# Patient Record
Sex: Female | Born: 1985
Health system: Southern US, Community
[De-identification: ages and names within clinical notes are randomized; demographics above are authoritative.]

## PROBLEM LIST (undated history)

## (undated) DIAGNOSIS — E669 Obesity, unspecified: Secondary | ICD-10-CM

## (undated) DIAGNOSIS — R079 Chest pain, unspecified: Secondary | ICD-10-CM

## (undated) DIAGNOSIS — E0500 Thyrotoxicosis with diffuse goiter without thyrotoxic crisis or storm: Secondary | ICD-10-CM

## (undated) DIAGNOSIS — I493 Ventricular premature depolarization: Secondary | ICD-10-CM

## (undated) DIAGNOSIS — E0590 Thyrotoxicosis, unspecified without thyrotoxic crisis or storm: Secondary | ICD-10-CM

## (undated) DIAGNOSIS — E063 Autoimmune thyroiditis: Secondary | ICD-10-CM

## (undated) DIAGNOSIS — F329 Major depressive disorder, single episode, unspecified: Secondary | ICD-10-CM

## (undated) DIAGNOSIS — Z9889 Other specified postprocedural states: Secondary | ICD-10-CM

## (undated) DIAGNOSIS — E559 Vitamin D deficiency, unspecified: Secondary | ICD-10-CM

## (undated) DIAGNOSIS — N719 Inflammatory disease of uterus, unspecified: Secondary | ICD-10-CM

## (undated) DIAGNOSIS — F419 Anxiety disorder, unspecified: Secondary | ICD-10-CM

## (undated) DIAGNOSIS — Z8639 Personal history of other endocrine, nutritional and metabolic disease: Secondary | ICD-10-CM

## (undated) DIAGNOSIS — O8612 Endometritis following delivery: Secondary | ICD-10-CM

## (undated) DIAGNOSIS — Z98891 History of uterine scar from previous surgery: Secondary | ICD-10-CM

## (undated) HISTORY — DX: Vitamin D deficiency, unspecified: E55.9

## (undated) HISTORY — DX: Autoimmune thyroiditis: E06.3

## (undated) HISTORY — DX: Thyrotoxicosis with diffuse goiter without thyrotoxic crisis or storm: E05.00

## (undated) HISTORY — DX: Anxiety disorder, unspecified: F41.9

## (undated) HISTORY — DX: Chest pain, unspecified: R07.9

## (undated) HISTORY — DX: Ventricular premature depolarization: I49.3

## (undated) HISTORY — DX: Endometritis following delivery: O86.12

## (undated) HISTORY — DX: Obesity, unspecified: E66.9

## (undated) HISTORY — DX: Inflammatory disease of uterus, unspecified: N71.9

## (undated) HISTORY — DX: History of uterine scar from previous surgery: Z98.891

## (undated) HISTORY — PX: CHOLECYSTECTOMY: SHX55

## (undated) HISTORY — DX: Personal history of other endocrine, nutritional and metabolic disease: Z86.39

## (undated) HISTORY — DX: Major depressive disorder, single episode, unspecified: F32.9

## (undated) HISTORY — PX: DILATION AND CURETTAGE OF UTERUS: SHX78

## (undated) HISTORY — DX: Other specified postprocedural states: Z98.890

---
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
## 2004-03-20 ENCOUNTER — Other Ambulatory Visit: Admission: RE | Admit: 2004-03-20 | Discharge: 2004-03-20 | Payer: Self-pay | Admitting: Obstetrics and Gynecology

## 2005-04-08 ENCOUNTER — Other Ambulatory Visit: Admission: RE | Admit: 2005-04-08 | Discharge: 2005-04-08 | Payer: Self-pay | Admitting: Obstetrics and Gynecology

## 2006-08-02 ENCOUNTER — Inpatient Hospital Stay (HOSPITAL_COMMUNITY): Admission: AD | Admit: 2006-08-02 | Discharge: 2006-08-02 | Payer: Self-pay | Admitting: Obstetrics and Gynecology

## 2006-08-10 ENCOUNTER — Inpatient Hospital Stay (HOSPITAL_COMMUNITY): Admission: RE | Admit: 2006-08-10 | Discharge: 2006-08-10 | Payer: Self-pay | Admitting: Obstetrics and Gynecology

## 2006-08-21 ENCOUNTER — Inpatient Hospital Stay (HOSPITAL_COMMUNITY): Admission: AD | Admit: 2006-08-21 | Discharge: 2006-08-24 | Payer: Self-pay | Admitting: Obstetrics and Gynecology

## 2006-08-21 ENCOUNTER — Inpatient Hospital Stay (HOSPITAL_COMMUNITY): Admission: AD | Admit: 2006-08-21 | Discharge: 2006-08-24 | Payer: Self-pay | Admitting: Obstetrics & Gynecology

## 2009-06-29 ENCOUNTER — Ambulatory Visit (HOSPITAL_COMMUNITY): Admission: RE | Admit: 2009-06-29 | Discharge: 2009-06-29 | Payer: Self-pay | Admitting: Obstetrics and Gynecology

## 2010-06-12 ENCOUNTER — Ambulatory Visit (HOSPITAL_COMMUNITY)
Admission: RE | Admit: 2010-06-12 | Discharge: 2010-06-12 | Payer: Self-pay | Source: Home / Self Care | Attending: Obstetrics and Gynecology | Admitting: Obstetrics and Gynecology

## 2010-06-26 ENCOUNTER — Inpatient Hospital Stay (HOSPITAL_COMMUNITY)
Admission: RE | Admit: 2010-06-26 | Discharge: 2010-06-28 | Payer: Self-pay | Source: Home / Self Care | Attending: Obstetrics and Gynecology | Admitting: Obstetrics and Gynecology

## 2010-07-02 LAB — CBC
HCT: 28.9 % — ABNORMAL LOW (ref 36.0–46.0)
Hemoglobin: 9.7 g/dL — ABNORMAL LOW (ref 12.0–15.0)
MCH: 31.9 pg (ref 26.0–34.0)
MCHC: 33.6 g/dL (ref 30.0–36.0)
MCV: 95.1 fL (ref 78.0–100.0)
Platelets: 130 10*3/uL — ABNORMAL LOW (ref 150–400)
RBC: 3.04 MIL/uL — ABNORMAL LOW (ref 3.87–5.11)
RDW: 13.5 % (ref 11.5–15.5)
WBC: 10.6 10*3/uL — ABNORMAL HIGH (ref 4.0–10.5)

## 2010-07-09 NOTE — Discharge Summary (Signed)
  Krista Long, Krista Long              ACCOUNT NO.:  [id number]  MEDICAL RECORD NO.:  [id number]          PATIENT TYPE:  INP  LOCATION:  9113                          FACILITY:  [facility]  PHYSICIAN:  Kendra H. Tenny Craw, MD     DATE OF BIRTH:  [birth year]  DATE OF ADMISSION:  06/26/2010 DATE OF DISCHARGE:  06/28/2010                              DISCHARGE SUMMARY   DISCHARGING PHYSICIAN:  Enrique Sack H. Tenny Craw, MD  HOSPITAL PROCEDURES: 1. Primary low transverse cesarean section. 2. Spinal anesthesia. 3. Circumcision of female infant.  HOSPITAL COURSE:  [name] is a 25 year old now G3, P2-0-1-1, who presented at 27 weeks and 0 days' estimated gestational age for scheduled primary low transverse cesarean section for persistent breech presentation.  Pregnancy had been uncomplicated except for persistent frank breech presentation.  On June 26, 2010, the patient underwent a primary low transverse cesarean section by Dr. Malva Limes for delivery of a vigorous female infant weighing 7 pounds 13 ounces with Apgar scores of 9 and 9.  Postoperatively, the patient did well.  The female infant had a circumcision performed by Dr. Conley Simmonds on June 27, 2010.  By June 28, 2010, the patient desired early discharge home.  She was instructed on signs or symptoms to be aware of for need to return to the hospital.  [name] will remain in place and she will follow up either Friday, June 29, 2010; or Monday, July 02, 2010, in the office for staple removal.  She will be given a prescription for Percocet 5 mg 1-2 p.o. q.4-6 hours as needed for pain.  DISCHARGE LABORATORY DATA:  White blood cell count 10.6, hemoglobin 9.7, hematocrit 28.9, platelets 130.     Freddrick March. Tenny Craw, MD     K[facility]/MEDQ  D:  06/28/2010  T:  06/28/2010  Job:  [id number]  Electronically Signed by Waynard Reeds MD on 07/09/2010 08:49:16 PM

## 2010-07-23 ENCOUNTER — Other Ambulatory Visit: Payer: Self-pay | Admitting: Obstetrics and Gynecology

## 2010-08-27 LAB — CBC
HCT: 36.7 % (ref 36.0–46.0)
Hemoglobin: 12.3 g/dL (ref 12.0–15.0)
MCH: 31.2 pg (ref 26.0–34.0)
MCHC: 33.5 g/dL (ref 30.0–36.0)
MCV: 93.1 fL (ref 78.0–100.0)
Platelets: 150 10*3/uL (ref 150–400)
RBC: 3.94 MIL/uL (ref 3.87–5.11)
RDW: 13.3 % (ref 11.5–15.5)
WBC: 8.3 10*3/uL (ref 4.0–10.5)

## 2010-08-27 LAB — SURGICAL PCR SCREEN
MRSA, PCR: NEGATIVE
Staphylococcus aureus: NEGATIVE

## 2010-08-27 LAB — RPR: RPR Ser Ql: NONREACTIVE

## 2010-09-02 LAB — CBC
HCT: 40.3 % (ref 36.0–46.0)
Hemoglobin: 13.7 g/dL (ref 12.0–15.0)
MCHC: 34.1 g/dL (ref 30.0–36.0)
MCV: 88.8 fL (ref 78.0–100.0)
Platelets: 203 10*3/uL (ref 150–400)
RBC: 4.53 MIL/uL (ref 3.87–5.11)
RDW: 12.8 % (ref 11.5–15.5)
WBC: 6.6 10*3/uL (ref 4.0–10.5)

## 2010-09-02 LAB — ABO/RH: ABO/RH(D): A POS

## 2013-05-25 ENCOUNTER — Other Ambulatory Visit: Payer: Self-pay | Admitting: Obstetrics and Gynecology

## 2013-05-25 LAB — OB RESULTS CONSOLE ANTIBODY SCREEN: ANTIBODY SCREEN: NEGATIVE

## 2013-05-25 LAB — OB RESULTS CONSOLE GC/CHLAMYDIA
CHLAMYDIA, DNA PROBE: NEGATIVE
GC PROBE AMP, GENITAL: NEGATIVE

## 2013-05-25 LAB — OB RESULTS CONSOLE RUBELLA ANTIBODY, IGM: RUBELLA: IMMUNE

## 2013-05-25 LAB — OB RESULTS CONSOLE HEPATITIS B SURFACE ANTIGEN: Hepatitis B Surface Ag: NEGATIVE

## 2013-05-25 LAB — OB RESULTS CONSOLE ABO/RH: RH Type: POSITIVE

## 2013-05-25 LAB — OB RESULTS CONSOLE HIV ANTIBODY (ROUTINE TESTING): HIV: NONREACTIVE

## 2013-05-25 LAB — OB RESULTS CONSOLE RPR: RPR: NONREACTIVE

## 2013-12-22 ENCOUNTER — Encounter (HOSPITAL_COMMUNITY): Payer: Self-pay

## 2013-12-22 ENCOUNTER — Encounter (HOSPITAL_COMMUNITY): Payer: BC Managed Care – PPO | Admitting: Anesthesiology

## 2013-12-22 ENCOUNTER — Inpatient Hospital Stay (HOSPITAL_COMMUNITY): Payer: BC Managed Care – PPO | Admitting: Anesthesiology

## 2013-12-22 ENCOUNTER — Inpatient Hospital Stay (HOSPITAL_COMMUNITY): Payer: BC Managed Care – PPO

## 2013-12-22 ENCOUNTER — Inpatient Hospital Stay (HOSPITAL_COMMUNITY)
Admission: AD | Admit: 2013-12-22 | Discharge: 2013-12-22 | Disposition: A | Discharge status: Home / Self Care | Payer: BC Managed Care – PPO | Source: Ambulatory Visit | Attending: Obstetrics and Gynecology | Admitting: Obstetrics and Gynecology

## 2013-12-22 ENCOUNTER — Encounter (HOSPITAL_COMMUNITY): Payer: Self-pay | Admitting: *Deleted

## 2013-12-22 ENCOUNTER — Inpatient Hospital Stay (HOSPITAL_COMMUNITY)
Admission: AD | Admit: 2013-12-22 | Discharge: 2013-12-25 | DRG: 765 | Disposition: A | Discharge status: Home / Self Care | Payer: BC Managed Care – PPO | Source: Ambulatory Visit | Attending: Obstetrics and Gynecology | Admitting: Obstetrics and Gynecology

## 2013-12-22 DIAGNOSIS — O479 False labor, unspecified: Secondary | ICD-10-CM | POA: Insufficient documentation

## 2013-12-22 DIAGNOSIS — O34219 Maternal care for unspecified type scar from previous cesarean delivery: Principal | ICD-10-CM | POA: Diagnosis present

## 2013-12-22 DIAGNOSIS — Z9889 Other specified postprocedural states: Secondary | ICD-10-CM

## 2013-12-22 DIAGNOSIS — O99284 Endocrine, nutritional and metabolic diseases complicating childbirth: Secondary | ICD-10-CM | POA: Diagnosis present

## 2013-12-22 DIAGNOSIS — E0590 Thyrotoxicosis, unspecified without thyrotoxic crisis or storm: Secondary | ICD-10-CM | POA: Diagnosis present

## 2013-12-22 DIAGNOSIS — E079 Disorder of thyroid, unspecified: Secondary | ICD-10-CM | POA: Diagnosis present

## 2013-12-22 DIAGNOSIS — D649 Anemia, unspecified: Secondary | ICD-10-CM | POA: Diagnosis present

## 2013-12-22 DIAGNOSIS — O9903 Anemia complicating the puerperium: Secondary | ICD-10-CM | POA: Diagnosis present

## 2013-12-22 HISTORY — DX: Thyrotoxicosis, unspecified without thyrotoxic crisis or storm: E05.90

## 2013-12-22 LAB — CBC
HCT: 37.5 % (ref 36.0–46.0)
HEMOGLOBIN: 13.4 g/dL (ref 12.0–15.0)
MCH: 32.1 pg (ref 26.0–34.0)
MCHC: 35.7 g/dL (ref 30.0–36.0)
MCV: 89.7 fL (ref 78.0–100.0)
Platelets: 169 10*3/uL (ref 150–400)
RBC: 4.18 MIL/uL (ref 3.87–5.11)
RDW: 12.9 % (ref 11.5–15.5)
WBC: 12.2 10*3/uL — AB (ref 4.0–10.5)

## 2013-12-22 LAB — OB RESULTS CONSOLE GBS: GBS: NEGATIVE

## 2013-12-22 MED ORDER — LACTATED RINGERS IV SOLN
INTRAVENOUS | Status: DC
  Administered 2013-12-22 – 2013-12-23 (×2): via INTRAVENOUS

## 2013-12-22 MED ORDER — LACTATED RINGERS IV SOLN
500.0000 mL | Freq: Once | INTRAVENOUS | Status: AC
  Administered 2013-12-22: 500 mL via INTRAVENOUS

## 2013-12-22 MED ORDER — EPHEDRINE 5 MG/ML INJ: 10.0000 mg | INTRAVENOUS | Status: DC | PRN

## 2013-12-22 MED ORDER — LIDOCAINE HCL (PF) 1 % IJ SOLN: 30.0000 mL | INTRAMUSCULAR | Status: DC | PRN

## 2013-12-22 MED ORDER — OXYTOCIN BOLUS FROM INFUSION: 500.0000 mL | INTRAVENOUS | Status: DC

## 2013-12-22 MED ORDER — FLEET ENEMA 7-19 GM/118ML RE ENEM
1.0000 | ENEMA | RECTAL | Status: DC | PRN
Start: 2013-12-22 — End: 2013-12-23

## 2013-12-22 MED ORDER — IBUPROFEN 600 MG PO TABS: 600.0000 mg | ORAL_TABLET | Freq: Four times a day (QID) | ORAL | Status: DC | PRN

## 2013-12-22 MED ORDER — OXYCODONE-ACETAMINOPHEN 5-325 MG PO TABS: 1.0000 | ORAL_TABLET | ORAL | Status: DC | PRN

## 2013-12-22 MED ORDER — DIPHENHYDRAMINE HCL 50 MG/ML IJ SOLN: 12.5000 mg | INTRAMUSCULAR | Status: DC | PRN

## 2013-12-22 MED ORDER — LACTATED RINGERS IV SOLN: 500.0000 mL | INTRAVENOUS | Status: DC | PRN

## 2013-12-22 MED ORDER — CITRIC ACID-SODIUM CITRATE 334-500 MG/5ML PO SOLN
30.0000 mL | ORAL | Status: DC | PRN
  Administered 2013-12-23: 30 mL via ORAL
  Filled 2013-12-22 (×2): qty 15

## 2013-12-22 MED ORDER — ACETAMINOPHEN 325 MG PO TABS: 650.0000 mg | ORAL_TABLET | ORAL | Status: DC | PRN

## 2013-12-22 MED ORDER — FENTANYL 2.5 MCG/ML BUPIVACAINE 1/10 % EPIDURAL INFUSION (WH - ANES)
14.0000 mL/h | INTRAMUSCULAR | Status: DC | PRN
  Administered 2013-12-22: 14 mL/h via EPIDURAL
  Filled 2013-12-22: qty 125

## 2013-12-22 MED ORDER — ONDANSETRON HCL 4 MG/2ML IJ SOLN: 4.0000 mg | Freq: Four times a day (QID) | INTRAMUSCULAR | Status: DC | PRN

## 2013-12-22 MED ORDER — OXYTOCIN 40 UNITS IN LACTATED RINGERS INFUSION - SIMPLE MED: 62.5000 mL/h | INTRAVENOUS | Status: DC

## 2013-12-22 MED ORDER — PHENYLEPHRINE 40 MCG/ML (10ML) SYRINGE FOR IV PUSH (FOR BLOOD PRESSURE SUPPORT): 80.0000 ug | PREFILLED_SYRINGE | INTRAVENOUS | Status: DC | PRN

## 2013-12-22 MED ORDER — PHENYLEPHRINE 40 MCG/ML (10ML) SYRINGE FOR IV PUSH (FOR BLOOD PRESSURE SUPPORT)
80.0000 ug | PREFILLED_SYRINGE | INTRAVENOUS | Status: DC | PRN
  Filled 2013-12-22: qty 10

## 2013-12-22 NOTE — Anesthesia Procedure Notes (Signed)
Epidural Patient location during procedure: OB Start time: 12/22/2013 11:48 PM  Staffing Performed by: anesthesiologist   Preanesthetic Checklist Completed: patient identified, site marked, surgical consent, pre-op evaluation, timeout performed, IV checked, risks and benefits discussed and monitors and equipment checked  Epidural Patient position: sitting Prep: site prepped and draped and DuraPrep Patient monitoring: continuous pulse ox and blood pressure Approach: midline Injection technique: LOR air  Needle:  Needle type: Tuohy  Needle gauge: 17 G Needle length: 9 cm and 9 Needle insertion depth: 5 cm cm Catheter type: closed end flexible Catheter size: 19 Gauge Catheter at skin depth: 10 cm Test dose: negative  Assessment Events: blood not aspirated, injection not painful, no injection resistance, negative IV test and no paresthesia  Additional Notes Discussed risk of headache, infection, bleeding, nerve injury and failed or incomplete block.  Patient voices understanding and wishes to proceed.  Epidural placed easily on first attempt.  No paresthesia.  Patient tolerated procedure well with no apparent complications.  Jasmine DecemberA. Krithik Mapel, MDReason for block:procedure for pain

## 2013-12-22 NOTE — MAU Note (Signed)
Pt. States contractions are stronger and closer together than when she was here earlier.Baby has been moving well. EFM placed.

## 2013-12-22 NOTE — MAU Note (Signed)
Patient states she is having contractions every 5-8 minutes. Denies bleeding or leaking today,. Reports less than usual fetal movement. Plans a TOLAC.

## 2013-12-22 NOTE — H&P (Addendum)
28 y.o. 7840w0d  [id number] comes in c/o labor- second visit tonight.  Hx of C/S for breech- desires TOLAC.  Otherwise has good fetal movement and no bleeding.  Pt's strip initially at second visit was not reactive and BPP was ordered.  NST became reactive just before being sent to [facility] and has been since without decels.  However, BPP was only 4/10 (no movement).  Cardiac activity appeared normal and pt has sensation now of movement.  Admitted for labor and monitoring of baby.  Past Medical History  Diagnosis Date  . Hyperthyroidism     Past Surgical History  Procedure Laterality Date  . Cesarean section    . Dilation and curettage of uterus    . Cholecystectomy      OB History  Gravida Para Term Preterm AB SAB TAB Ectopic Multiple Living  4 2 2  1 1    2     # Outcome Date GA Lbr Len/2nd Weight Sex Delivery Anes PTL Lv  4 CUR           3 SAB           2 TRM           1 TRM               History   Social History  . Marital Status: Married    Spouse Name: N/A    Number of Children: N/A  . Years of Education: N/A   Occupational History  . Not on file.   Social History Main Topics  . Smoking status: Never Smoker   . Smokeless tobacco: Not on file  . Alcohol Use: No  . Drug Use: No  . Sexual Activity: Yes    Birth Control/ Protection: None   Other Topics Concern  . Not on file   Social History Narrative  . No narrative on file   Review of patient's allergies indicates no known allergies.    Prenatal Transfer Tool  Maternal Diabetes: No Genetic Screening: Declined Maternal Ultrasounds/Referrals: Normal Fetal Ultrasounds or other Referrals:  None Maternal Substance Abuse:  No Significant Maternal Medications:  None Significant Maternal Lab Results: None  Other PNC: uncomplicated.    Filed Vitals:   12/22/13 1952  BP: 117/73  Pulse: 99  Temp: 98 F (36.7 C)  Resp: 16     Lungs/Cor:  NAD Abdomen:  soft, gravid Ex:  no cords, erythema SVE:  3/80/-2 per MAU  nurse; now 4/80/-2, AROM clear FHTs:  120, good STV, NST R, NO decels- cat 1 Toco:  q5-10   A/P   Early active labor which may have affected BPP- admit with continuous monitoring; hold IV pain meds.  GBS neg.   Desires TOLAC.  IUPC and FSE placed for close monitoring.   Dain Laseter A

## 2013-12-22 NOTE — Discharge Instructions (Signed)
Third Trimester of Pregnancy °The third trimester is from week 29 through week 42, months 7 through 9. The third trimester is a time when the fetus is growing rapidly. At the end of the ninth month, the fetus is about 20 inches in length and weighs 6-10 pounds.  °BODY CHANGES °Your body goes through many changes during pregnancy. The changes vary from woman to woman.  °· Your weight will continue to increase. You can expect to gain 25-35 pounds (11-16 kg) by the end of the pregnancy. °· You may begin to get stretch marks on your hips, abdomen, and breasts. °· You may urinate more often because the fetus is moving lower into your pelvis and pressing on your bladder. °· You may develop or continue to have heartburn as a result of your pregnancy. °· You may develop constipation because certain hormones are causing the muscles that push waste through your intestines to slow down. °· You may develop hemorrhoids or swollen, bulging veins (varicose veins). °· You may have pelvic pain because of the weight gain and pregnancy hormones relaxing your joints between the bones in your pelvis. Backaches may result from overexertion of the muscles supporting your posture. °· You may have changes in your hair. These can include thickening of your hair, rapid growth, and changes in texture. Some women also have hair loss during or after pregnancy, or hair that feels dry or thin. Your hair will most likely return to normal after your baby is born. °· Your breasts will continue to grow and be tender. A yellow discharge may leak from your breasts called colostrum. °· Your belly button may stick out. °· You may feel short of breath because of your expanding uterus. °· You may notice the fetus "dropping," or moving lower in your abdomen. °· You may have a bloody mucus discharge. This usually occurs a few days to a week before labor begins. °· Your cervix becomes thin and soft (effaced) near your due date. °WHAT TO EXPECT AT YOUR PRENATAL  EXAMS  °You will have prenatal exams every 2 weeks until week 36. Then, you will have weekly prenatal exams. During a routine prenatal visit: °· You will be weighed to make sure you and the fetus are growing normally. °· Your blood pressure is taken. °· Your abdomen will be measured to track your baby's growth. °· The fetal heartbeat will be listened to. °· Any test results from the previous visit will be discussed. °· You may have a cervical check near your due date to see if you have effaced. °At around 36 weeks, your caregiver will check your cervix. At the same time, your caregiver will also perform a test on the secretions of the vaginal tissue. This test is to determine if a type of bacteria, Group B streptococcus, is present. Your caregiver will explain this further. °Your caregiver may ask you: °· What your birth plan is. °· How you are feeling. °· If you are feeling the baby move. °· If you have had any abnormal symptoms, such as leaking fluid, bleeding, severe headaches, or abdominal cramping. °· If you have any questions. °Other tests or screenings that may be performed during your third trimester include: °· Blood tests that check for low iron levels (anemia). °· Fetal testing to check the health, activity level, and growth of the fetus. Testing is done if you have certain medical conditions or if there are problems during the pregnancy. °FALSE LABOR °You may feel small, irregular contractions that   eventually go away. These are called Braxton Hicks contractions, or false labor. Contractions may last for hours, days, or even weeks before true labor sets in. If contractions come at regular intervals, intensify, or become painful, it is best to be seen by your caregiver.  °SIGNS OF LABOR  °· Menstrual-like cramps. °· Contractions that are 5 minutes apart or less. °· Contractions that start on the top of the uterus and spread down to the lower abdomen and back. °· A sense of increased pelvic pressure or back  pain. °· A watery or bloody mucus discharge that comes from the vagina. °If you have any of these signs before the 37th week of pregnancy, call your caregiver right away. You need to go to the hospital to get checked immediately. °HOME CARE INSTRUCTIONS  °· Avoid all smoking, herbs, alcohol, and unprescribed drugs. These chemicals affect the formation and growth of the baby. °· Follow your caregiver's instructions regarding medicine use. There are medicines that are either safe or unsafe to take during pregnancy. °· Exercise only as directed by your caregiver. Experiencing uterine cramps is a good sign to stop exercising. °· Continue to eat regular, healthy meals. °· Wear a good support bra for breast tenderness. °· Do not use hot tubs, steam rooms, or saunas. °· Wear your seat belt at all times when driving. °· Avoid raw meat, uncooked cheese, cat litter boxes, and soil used by cats. These carry germs that can cause birth defects in the baby. °· Take your prenatal vitamins. °· Try taking a stool softener (if your caregiver approves) if you develop constipation. Eat more high-fiber foods, such as fresh vegetables or fruit and whole grains. Drink plenty of fluids to keep your urine clear or pale yellow. °· Take warm sitz baths to soothe any pain or discomfort caused by hemorrhoids. Use hemorrhoid cream if your caregiver approves. °· If you develop varicose veins, wear support hose. Elevate your feet for 15 minutes, 3-4 times a day. Limit salt in your diet. °· Avoid heavy lifting, wear low heal shoes, and practice good posture. °· Rest a lot with your legs elevated if you have leg cramps or low back pain. °· Visit your dentist if you have not gone during your pregnancy. Use a soft toothbrush to brush your teeth and be gentle when you floss. °· A sexual relationship may be continued unless your caregiver directs you otherwise. °· Do not travel far distances unless it is absolutely necessary and only with the approval  of your caregiver. °· Take prenatal classes to understand, practice, and ask questions about the labor and delivery. °· Make a trial run to the hospital. °· Pack your hospital bag. °· Prepare the baby's nursery. °· Continue to go to all your prenatal visits as directed by your caregiver. °SEEK MEDICAL CARE IF: °· You are unsure if you are in labor or if your water has broken. °· You have dizziness. °· You have mild pelvic cramps, pelvic pressure, or nagging pain in your abdominal area. °· You have persistent nausea, vomiting, or diarrhea. °· You have a bad smelling vaginal discharge. °· You have pain with urination. °SEEK IMMEDIATE MEDICAL CARE IF:  °· You have a fever. °· You are leaking fluid from your vagina. °· You have spotting or bleeding from your vagina. °· You have severe abdominal cramping or pain. °· You have rapid weight loss or gain. °· You have shortness of breath with chest pain. °· You notice sudden or extreme swelling   of your face, hands, ankles, feet, or legs. °· You have not felt your baby move in over an hour. °· You have severe headaches that do not go away with medicine. °· You have vision changes. °Document Released: 05/28/2001 Document Revised: 06/08/2013 Document Reviewed: 08/04/2012 °[employer]® Patient Information ©2015 [employer]. This information is not intended to replace advice given to you by your health care provider. Make sure you discuss any questions you have with your health care provider. °Fetal Movement Counts °Patient Name: __________________________________________________ Patient Due Date: ____________________ °Performing a fetal movement count is highly recommended in high-risk pregnancies, but it is good for every pregnant woman to do. Your caregiver may ask you to start counting fetal movements at 28 weeks of the pregnancy. Fetal movements often increase: °· After eating a full meal. °· After physical activity. °· After eating or drinking something sweet or cold. °· At  rest. °Pay attention to when you feel the baby is most active. This will help you notice a pattern of your baby's sleep and wake cycles and what factors contribute to an increase in fetal movement. It is important to perform a fetal movement count at the same time each day when your baby is normally most active.  °HOW TO COUNT FETAL MOVEMENTS °1. Find a quiet and comfortable area to sit or lie down on your left side. Lying on your left side provides the best blood and oxygen circulation to your baby. °2. Write down the day and time on a sheet of paper or in a journal. °3. Start counting kicks, flutters, swishes, rolls, or jabs in a 2 hour period. You should feel at least 10 movements within 2 hours. °4. If you do not feel 10 movements in 2 hours, wait 2-3 hours and count again. Look for a change in the pattern or not enough counts in 2 hours. °SEEK MEDICAL CARE IF: °· You feel less than 10 counts in 2 hours, tried twice. °· There is no movement in over an hour. °· The pattern is changing or taking longer each day to reach 10 counts in 2 hours. °· You feel the baby is not moving as he or she usually does. °Date: ____________ Movements: ____________ Start time: ____________ Finish time: ____________  °Date: ____________ Movements: ____________ Start time: ____________ Finish time: ____________ °Date: ____________ Movements: ____________ Start time: ____________ Finish time: ____________ °Date: ____________ Movements: ____________ Start time: ____________ Finish time: ____________ °Date: ____________ Movements: ____________ Start time: ____________ Finish time: ____________ °Date: ____________ Movements: ____________ Start time: ____________ Finish time: ____________ °Date: ____________ Movements: ____________ Start time: ____________ Finish time: ____________ °Date: ____________ Movements: ____________ Start time: ____________ Finish time: ____________  °Date: ____________ Movements: ____________ Start time:  ____________ Finish time: ____________ °Date: ____________ Movements: ____________ Start time: ____________ Finish time: ____________ °Date: ____________ Movements: ____________ Start time: ____________ Finish time: ____________ °Date: ____________ Movements: ____________ Start time: ____________ Finish time: ____________ °Date: ____________ Movements: ____________ Start time: ____________ Finish time: ____________ °Date: ____________ Movements: ____________ Start time: ____________ Finish time: ____________ °Date: ____________ Movements: ____________ Start time: ____________ Finish time: ____________  °Date: ____________ Movements: ____________ Start time: ____________ Finish time: ____________ °Date: ____________ Movements: ____________ Start time: ____________ Finish time: ____________ °Date: ____________ Movements: ____________ Start time: ____________ Finish time: ____________ °Date: ____________ Movements: ____________ Start time: ____________ Finish time: ____________ °Date: ____________ Movements: ____________ Start time: ____________ Finish time: ____________ °Date: ____________ Movements: ____________ Start time: ____________ Finish time: ____________ °Date: ____________ Movements: ____________ Start time: ____________ Finish time: ____________  °  Date: ____________ Movements: ____________ Start time: ____________ Finish time: ____________ °Date: ____________ Movements: ____________ Start time: ____________ Finish time: ____________ °Date: ____________ Movements: ____________ Start time: ____________ Finish time: ____________ °Date: ____________ Movements: ____________ Start time: ____________ Finish time: ____________ °Date: ____________ Movements: ____________ Start time: ____________ Finish time: ____________ °Date: ____________ Movements: ____________ Start time: ____________ Finish time: ____________ °Date: ____________ Movements: ____________ Start time: ____________ Finish time: ____________  °Date:  ____________ Movements: ____________ Start time: ____________ Finish time: ____________ °Date: ____________ Movements: ____________ Start time: ____________ Finish time: ____________ °Date: ____________ Movements: ____________ Start time: ____________ Finish time: ____________ °Date: ____________ Movements: ____________ Start time: ____________ Finish time: ____________ °Date: ____________ Movements: ____________ Start time: ____________ Finish time: ____________ °Date: ____________ Movements: ____________ Start time: ____________ Finish time: ____________ °Date: ____________ Movements: ____________ Start time: ____________ Finish time: ____________  °Date: ____________ Movements: ____________ Start time: ____________ Finish time: ____________ °Date: ____________ Movements: ____________ Start time: ____________ Finish time: ____________ °Date: ____________ Movements: ____________ Start time: ____________ Finish time: ____________ °Date: ____________ Movements: ____________ Start time: ____________ Finish time: ____________ °Date: ____________ Movements: ____________ Start time: ____________ Finish time: ____________ °Date: ____________ Movements: ____________ Start time: ____________ Finish time: ____________ °Date: ____________ Movements: ____________ Start time: ____________ Finish time: ____________  °Date: ____________ Movements: ____________ Start time: ____________ Finish time: ____________ °Date: ____________ Movements: ____________ Start time: ____________ Finish time: ____________ °Date: ____________ Movements: ____________ Start time: ____________ Finish time: ____________ °Date: ____________ Movements: ____________ Start time: ____________ Finish time: ____________ °Date: ____________ Movements: ____________ Start time: ____________ Finish time: ____________ °Date: ____________ Movements: ____________ Start time: ____________ Finish time: ____________ °Date: ____________ Movements: ____________ Start  time: ____________ Finish time: ____________  °Date: ____________ Movements: ____________ Start time: ____________ Finish time: ____________ °Date: ____________ Movements: ____________ Start time: ____________ Finish time: ____________ °Date: ____________ Movements: ____________ Start time: ____________ Finish time: ____________ °Date: ____________ Movements: ____________ Start time: ____________ Finish time: ____________ °Date: ____________ Movements: ____________ Start time: ____________ Finish time: ____________ °Date: ____________ Movements: ____________ Start time: ____________ Finish time: ____________ °Document Released: 07/03/2006 Document Revised: 05/20/2012 Document Reviewed: 03/30/2012 °[employer]® Patient Information ©2015 [employer]. This information is not intended to replace advice given to you by your health care provider. Make sure you discuss any questions you have with your health care provider. ° °

## 2013-12-22 NOTE — Anesthesia Preprocedure Evaluation (Signed)
Anesthesia Evaluation  Patient identified by MRN, date of birth, ID band Patient awake    Reviewed: Allergy & Precautions, H&P , NPO status , Patient's Chart, lab work & pertinent test results, reviewed documented beta blocker date and time   History of Anesthesia Complications Negative for: history of anesthetic complications  Airway Mallampati: II TM Distance: >3 FB Neck ROM: full    Dental  (+) Teeth Intact   Pulmonary neg pulmonary ROS,  breath sounds clear to auscultation        Cardiovascular negative cardio ROS  Rhythm:regular Rate:Normal     Neuro/Psych negative neurological ROS  negative psych ROS   GI/Hepatic Neg liver ROS, GERD-  Medicated,  Endo/Other  Hyperthyroidism   Renal/GU negative Renal ROS     Musculoskeletal   Abdominal   Peds  Hematology negative hematology ROS (+)   Anesthesia Other Findings   Reproductive/Obstetrics (+) Pregnancy (h/o c/s x1 for breech, attempting VBAC)                           Anesthesia Physical Anesthesia Plan  ASA: II  Anesthesia Plan: Epidural   Post-op Pain Management:    Induction:   Airway Management Planned:   Additional Equipment:   Intra-op Plan:   Post-operative Plan:   Informed Consent: I have reviewed the patients History and Physical, chart, labs and discussed the procedure including the risks, benefits and alternatives for the proposed anesthesia with the patient or authorized representative who has indicated his/her understanding and acceptance.     Plan Discussed with:   Anesthesia Plan Comments:         Anesthesia Quick Evaluation

## 2013-12-23 ENCOUNTER — Inpatient Hospital Stay (HOSPITAL_COMMUNITY): Payer: BC Managed Care – PPO

## 2013-12-23 ENCOUNTER — Encounter (HOSPITAL_COMMUNITY): Payer: Self-pay

## 2013-12-23 ENCOUNTER — Encounter (HOSPITAL_COMMUNITY)
Admission: AD | Disposition: A | Discharge status: Home / Self Care | Payer: Self-pay | Source: Ambulatory Visit | Attending: Obstetrics and Gynecology

## 2013-12-23 DIAGNOSIS — Z9889 Other specified postprocedural states: Secondary | ICD-10-CM

## 2013-12-23 HISTORY — DX: Other specified postprocedural states: Z98.890

## 2013-12-23 LAB — CBC
HCT: 23.3 % — ABNORMAL LOW (ref 36.0–46.0)
HCT: 21 % — ABNORMAL LOW (ref 36.0–46.0)
HEMATOCRIT: 25.4 % — AB (ref 36.0–46.0)
Hemoglobin: 8.8 g/dL — ABNORMAL LOW (ref 12.0–15.0)
Hemoglobin: 8.1 g/dL — ABNORMAL LOW (ref 12.0–15.0)
Hemoglobin: 7.1 g/dL — ABNORMAL LOW (ref 12.0–15.0)
MCH: 31.3 pg (ref 26.0–34.0)
MCH: 31.3 pg (ref 26.0–34.0)
MCH: 30.5 pg (ref 26.0–34.0)
MCHC: 34.6 g/dL (ref 30.0–36.0)
MCHC: 34.8 g/dL (ref 30.0–36.0)
MCHC: 33.8 g/dL (ref 30.0–36.0)
MCV: 90.4 fL (ref 78.0–100.0)
MCV: 90 fL (ref 78.0–100.0)
MCV: 90.1 fL (ref 78.0–100.0)
PLATELETS: 135 10*3/uL — AB (ref 150–400)
Platelets: 161 10*3/uL (ref 150–400)
Platelets: 158 10*3/uL (ref 150–400)
RBC: 2.81 MIL/uL — AB (ref 3.87–5.11)
RBC: 2.59 MIL/uL — ABNORMAL LOW (ref 3.87–5.11)
RBC: 2.33 MIL/uL — AB (ref 3.87–5.11)
RDW: 12.7 % (ref 11.5–15.5)
RDW: 12.8 % (ref 11.5–15.5)
RDW: 12.9 % (ref 11.5–15.5)
WBC: 15.7 10*3/uL — ABNORMAL HIGH (ref 4.0–10.5)
WBC: 13.6 10*3/uL — ABNORMAL HIGH (ref 4.0–10.5)
WBC: 10.9 10*3/uL — ABNORMAL HIGH (ref 4.0–10.5)

## 2013-12-23 LAB — PREPARE RBC (CROSSMATCH)

## 2013-12-23 LAB — RPR

## 2013-12-23 LAB — BIRTH TISSUE RECOVERY COLLECTION (PLACENTA DONATION)

## 2013-12-23 SURGERY — Surgical Case
Anesthesia: Epidural | Site: Abdomen

## 2013-12-23 MED ORDER — LANOLIN HYDROUS EX OINT: 1.0000 "application " | TOPICAL_OINTMENT | CUTANEOUS | Status: DC | PRN

## 2013-12-23 MED ORDER — PRENATAL MULTIVITAMIN CH
1.0000 | ORAL_TABLET | Freq: Every day | ORAL | Status: DC
  Administered 2013-12-24 – 2013-12-25 (×2): 1 via ORAL
  Filled 2013-12-23 (×2): qty 1

## 2013-12-23 MED ORDER — PHENYLEPHRINE HCL 10 MG/ML IJ SOLN
INTRAMUSCULAR | Status: DC | PRN
  Administered 2013-12-23 (×2): 80 ug via INTRAVENOUS

## 2013-12-23 MED ORDER — DIPHENHYDRAMINE HCL 25 MG PO CAPS: 25.0000 mg | ORAL_CAPSULE | ORAL | Status: DC | PRN

## 2013-12-23 MED ORDER — PHENYLEPHRINE 40 MCG/ML (10ML) SYRINGE FOR IV PUSH (FOR BLOOD PRESSURE SUPPORT)
PREFILLED_SYRINGE | INTRAVENOUS | Status: AC
  Filled 2013-12-23: qty 5

## 2013-12-23 MED ORDER — ONDANSETRON HCL 4 MG PO TABS: 4.0000 mg | ORAL_TABLET | ORAL | Status: DC | PRN

## 2013-12-23 MED ORDER — ZOLPIDEM TARTRATE 5 MG PO TABS: 5.0000 mg | ORAL_TABLET | Freq: Every evening | ORAL | Status: DC | PRN

## 2013-12-23 MED ORDER — KETOROLAC TROMETHAMINE 30 MG/ML IJ SOLN: 30.0000 mg | Freq: Four times a day (QID) | INTRAMUSCULAR | Status: AC | PRN

## 2013-12-23 MED ORDER — FERROUS SULFATE 325 (65 FE) MG PO TABS
325.0000 mg | ORAL_TABLET | Freq: Two times a day (BID) | ORAL | Status: DC
  Administered 2013-12-24 – 2013-12-25 (×3): 325 mg via ORAL
  Filled 2013-12-23 (×3): qty 1

## 2013-12-23 MED ORDER — FAMOTIDINE 20 MG PO TABS
10.0000 mg | ORAL_TABLET | Freq: Two times a day (BID) | ORAL | Status: DC
  Administered 2013-12-23 – 2013-12-24 (×2): 10 mg via ORAL
  Administered 2013-12-25: 20 mg via ORAL
  Filled 2013-12-23: qty 0.5
  Filled 2013-12-23 (×4): qty 1

## 2013-12-23 MED ORDER — OXYTOCIN 10 UNIT/ML IJ SOLN
INTRAMUSCULAR | Status: AC
  Filled 2013-12-23: qty 4

## 2013-12-23 MED ORDER — NALBUPHINE HCL 10 MG/ML IJ SOLN: 5.0000 mg | INTRAMUSCULAR | Status: DC | PRN

## 2013-12-23 MED ORDER — MENTHOL 3 MG MT LOZG: 1.0000 | LOZENGE | OROMUCOSAL | Status: DC | PRN

## 2013-12-23 MED ORDER — LACTATED RINGERS IV SOLN
INTRAVENOUS | Status: DC | PRN
  Administered 2013-12-23: 04:00:00 via INTRAVENOUS

## 2013-12-23 MED ORDER — SODIUM BICARBONATE 8.4 % IV SOLN
INTRAVENOUS | Status: DC | PRN
  Administered 2013-12-23: 2 mL via EPIDURAL
  Administered 2013-12-23: 3 mL via EPIDURAL
  Administered 2013-12-23 (×2): 5 mL via EPIDURAL

## 2013-12-23 MED ORDER — TETANUS-DIPHTH-ACELL PERTUSSIS 5-2.5-18.5 LF-MCG/0.5 IM SUSP: 0.5000 mL | Freq: Once | INTRAMUSCULAR | Status: DC

## 2013-12-23 MED ORDER — FENTANYL CITRATE 0.05 MG/ML IJ SOLN
INTRAMUSCULAR | Status: AC
  Filled 2013-12-23: qty 2

## 2013-12-23 MED ORDER — OXYTOCIN 40 UNITS IN LACTATED RINGERS INFUSION - SIMPLE MED: 62.5000 mL/h | INTRAVENOUS | Status: AC

## 2013-12-23 MED ORDER — MEPERIDINE HCL 25 MG/ML IJ SOLN: 6.2500 mg | INTRAMUSCULAR | Status: DC | PRN

## 2013-12-23 MED ORDER — LACTATED RINGERS IV SOLN
INTRAVENOUS | Status: DC
  Administered 2013-12-23: 12:00:00 via INTRAVENOUS

## 2013-12-23 MED ORDER — LACTATED RINGERS IV SOLN
INTRAVENOUS | Status: DC
  Administered 2013-12-23: 13:00:00 via INTRAVENOUS

## 2013-12-23 MED ORDER — KETOROLAC TROMETHAMINE 60 MG/2ML IM SOLN
INTRAMUSCULAR | Status: AC
  Filled 2013-12-23: qty 2

## 2013-12-23 MED ORDER — SODIUM CHLORIDE 0.9 % IJ SOLN
3.0000 mL | INTRAMUSCULAR | Status: DC | PRN
  Administered 2013-12-24: 3 mL via INTRAVENOUS

## 2013-12-23 MED ORDER — CEFAZOLIN SODIUM-DEXTROSE 2-3 GM-% IV SOLR
INTRAVENOUS | Status: DC | PRN
  Administered 2013-12-23: 2 g via INTRAVENOUS

## 2013-12-23 MED ORDER — MORPHINE SULFATE 0.5 MG/ML IJ SOLN
INTRAMUSCULAR | Status: AC
  Filled 2013-12-23: qty 10

## 2013-12-23 MED ORDER — MEASLES, MUMPS & RUBELLA VAC ~~LOC~~ INJ: 0.5000 mL | INJECTION | Freq: Once | SUBCUTANEOUS | Status: DC

## 2013-12-23 MED ORDER — ONDANSETRON HCL 4 MG/2ML IJ SOLN: 4.0000 mg | Freq: Three times a day (TID) | INTRAMUSCULAR | Status: DC | PRN

## 2013-12-23 MED ORDER — SIMETHICONE 80 MG PO CHEW
80.0000 mg | CHEWABLE_TABLET | ORAL | Status: DC
  Administered 2013-12-24 (×2): 80 mg via ORAL
  Filled 2013-12-23 (×2): qty 1

## 2013-12-23 MED ORDER — SCOPOLAMINE 1 MG/3DAYS TD PT72
MEDICATED_PATCH | TRANSDERMAL | Status: AC
  Filled 2013-12-23: qty 1

## 2013-12-23 MED ORDER — ONDANSETRON HCL 4 MG/2ML IJ SOLN
INTRAMUSCULAR | Status: AC
  Filled 2013-12-23: qty 2

## 2013-12-23 MED ORDER — ONDANSETRON HCL 4 MG/2ML IJ SOLN
INTRAMUSCULAR | Status: DC | PRN
  Administered 2013-12-23: 4 mg via INTRAVENOUS

## 2013-12-23 MED ORDER — IOHEXOL 300 MG/ML  SOLN
100.0000 mL | Freq: Once | INTRAMUSCULAR | Status: AC | PRN
  Administered 2013-12-23: 100 mL via INTRAVENOUS

## 2013-12-23 MED ORDER — LIDOCAINE HCL (PF) 1 % IJ SOLN
INTRAMUSCULAR | Status: DC | PRN
  Administered 2013-12-22 (×2): 4 mL
  Administered 2013-12-22: 2 mL
  Administered 2013-12-22: 4 mL

## 2013-12-23 MED ORDER — SCOPOLAMINE 1 MG/3DAYS TD PT72
1.0000 | MEDICATED_PATCH | Freq: Once | TRANSDERMAL | Status: DC
  Administered 2013-12-23: 1.5 mg via TRANSDERMAL

## 2013-12-23 MED ORDER — PHENYLEPHRINE 8 MG IN D5W 100 ML (0.08MG/ML) PREMIX OPTIME
INJECTION | INTRAVENOUS | Status: AC
  Filled 2013-12-23: qty 100

## 2013-12-23 MED ORDER — FENTANYL CITRATE 0.05 MG/ML IJ SOLN
INTRAMUSCULAR | Status: DC | PRN
  Administered 2013-12-23: 100 ug via EPIDURAL

## 2013-12-23 MED ORDER — METHYLERGONOVINE MALEATE 0.2 MG PO TABS: 0.2000 mg | ORAL_TABLET | ORAL | Status: DC | PRN

## 2013-12-23 MED ORDER — FENTANYL CITRATE 0.05 MG/ML IJ SOLN
100.0000 ug | Freq: Once | INTRAMUSCULAR | Status: AC
  Administered 2013-12-23 (×2): 100 ug via EPIDURAL
  Filled 2013-12-23: qty 2

## 2013-12-23 MED ORDER — FENTANYL CITRATE 0.05 MG/ML IJ SOLN: 25.0000 ug | INTRAMUSCULAR | Status: DC | PRN

## 2013-12-23 MED ORDER — BISACODYL 10 MG RE SUPP: 10.0000 mg | Freq: Every day | RECTAL | Status: DC | PRN

## 2013-12-23 MED ORDER — SIMETHICONE 80 MG PO CHEW
80.0000 mg | CHEWABLE_TABLET | Freq: Three times a day (TID) | ORAL | Status: DC
  Administered 2013-12-23 – 2013-12-25 (×5): 80 mg via ORAL
  Filled 2013-12-23 (×6): qty 1

## 2013-12-23 MED ORDER — OXYTOCIN 10 UNIT/ML IJ SOLN
40.0000 [IU] | INTRAVENOUS | Status: DC | PRN
  Administered 2013-12-23: 40 [IU] via INTRAVENOUS

## 2013-12-23 MED ORDER — OXYCODONE-ACETAMINOPHEN 5-325 MG PO TABS
1.0000 | ORAL_TABLET | ORAL | Status: DC | PRN
  Administered 2013-12-24 – 2013-12-25 (×5): 1 via ORAL
  Filled 2013-12-23 (×5): qty 1

## 2013-12-23 MED ORDER — BUPIVACAINE HCL (PF) 0.25 % IJ SOLN
INTRAMUSCULAR | Status: DC | PRN
  Administered 2013-12-23 (×2): 5 mL via EPIDURAL

## 2013-12-23 MED ORDER — PHENYLEPHRINE 8 MG IN D5W 100 ML (0.08MG/ML) PREMIX OPTIME
INJECTION | INTRAVENOUS | Status: DC | PRN
  Administered 2013-12-23: 80 ug/min via INTRAVENOUS

## 2013-12-23 MED ORDER — CEFAZOLIN SODIUM-DEXTROSE 2-3 GM-% IV SOLR: 2.0000 g | Freq: Once | INTRAVENOUS | Status: DC

## 2013-12-23 MED ORDER — FLEET ENEMA 7-19 GM/118ML RE ENEM: 1.0000 | ENEMA | Freq: Every day | RECTAL | Status: DC | PRN

## 2013-12-23 MED ORDER — NALOXONE HCL 1 MG/ML IJ SOLN: 1.0000 ug/kg/h | INTRAVENOUS | Status: DC | PRN

## 2013-12-23 MED ORDER — CEFAZOLIN SODIUM-DEXTROSE 2-3 GM-% IV SOLR
INTRAVENOUS | Status: AC
  Filled 2013-12-23: qty 50

## 2013-12-23 MED ORDER — DIBUCAINE 1 % RE OINT: 1.0000 "application " | TOPICAL_OINTMENT | RECTAL | Status: DC | PRN

## 2013-12-23 MED ORDER — IBUPROFEN 600 MG PO TABS
600.0000 mg | ORAL_TABLET | Freq: Four times a day (QID) | ORAL | Status: DC
  Administered 2013-12-23 – 2013-12-25 (×7): 600 mg via ORAL
  Filled 2013-12-23 (×7): qty 1

## 2013-12-23 MED ORDER — METOCLOPRAMIDE HCL 5 MG/ML IJ SOLN
INTRAMUSCULAR | Status: AC
  Filled 2013-12-23: qty 2

## 2013-12-23 MED ORDER — MORPHINE SULFATE (PF) 0.5 MG/ML IJ SOLN
INTRAMUSCULAR | Status: DC | PRN
  Administered 2013-12-23: 4 mg via EPIDURAL

## 2013-12-23 MED ORDER — LACTATED RINGERS IV SOLN
INTRAVENOUS | Status: DC
  Administered 2013-12-23 (×3): via INTRAVENOUS

## 2013-12-23 MED ORDER — BUTORPHANOL TARTRATE 1 MG/ML IJ SOLN
1.0000 mg | INTRAMUSCULAR | Status: DC | PRN
  Administered 2013-12-23: 1 mg via INTRAVENOUS
  Filled 2013-12-23: qty 1

## 2013-12-23 MED ORDER — WITCH HAZEL-GLYCERIN EX PADS: 1.0000 "application " | MEDICATED_PAD | CUTANEOUS | Status: DC | PRN

## 2013-12-23 MED ORDER — DIPHENHYDRAMINE HCL 25 MG PO CAPS
25.0000 mg | ORAL_CAPSULE | Freq: Four times a day (QID) | ORAL | Status: DC | PRN
Start: 2013-12-23 — End: 2013-12-25

## 2013-12-23 MED ORDER — NALOXONE HCL 0.4 MG/ML IJ SOLN: 0.4000 mg | INTRAMUSCULAR | Status: DC | PRN

## 2013-12-23 MED ORDER — LACTATED RINGERS IV BOLUS (SEPSIS)
500.0000 mL | Freq: Once | INTRAVENOUS | Status: AC
  Administered 2013-12-23: 500 mL via INTRAVENOUS

## 2013-12-23 MED ORDER — METHYLERGONOVINE MALEATE 0.2 MG/ML IJ SOLN: 0.2000 mg | INTRAMUSCULAR | Status: DC | PRN

## 2013-12-23 MED ORDER — SENNOSIDES-DOCUSATE SODIUM 8.6-50 MG PO TABS
2.0000 | ORAL_TABLET | ORAL | Status: DC
  Administered 2013-12-24 (×2): 2 via ORAL
  Filled 2013-12-23 (×2): qty 2

## 2013-12-23 MED ORDER — ONDANSETRON HCL 4 MG/2ML IJ SOLN: 4.0000 mg | INTRAMUSCULAR | Status: DC | PRN

## 2013-12-23 MED ORDER — METOCLOPRAMIDE HCL 5 MG/ML IJ SOLN
10.0000 mg | Freq: Three times a day (TID) | INTRAMUSCULAR | Status: DC | PRN
  Administered 2013-12-23: 10 mg via INTRAVENOUS

## 2013-12-23 MED ORDER — SIMETHICONE 80 MG PO CHEW
80.0000 mg | CHEWABLE_TABLET | ORAL | Status: DC | PRN
  Administered 2013-12-23: 80 mg via ORAL
  Filled 2013-12-23: qty 1

## 2013-12-23 MED ORDER — DIPHENHYDRAMINE HCL 50 MG/ML IJ SOLN: 25.0000 mg | INTRAMUSCULAR | Status: DC | PRN

## 2013-12-23 MED ORDER — KETOROLAC TROMETHAMINE 60 MG/2ML IM SOLN
60.0000 mg | Freq: Once | INTRAMUSCULAR | Status: AC | PRN
  Administered 2013-12-23: 60 mg via INTRAMUSCULAR

## 2013-12-23 MED ORDER — METOCLOPRAMIDE HCL 5 MG/ML IJ SOLN: 10.0000 mg | Freq: Once | INTRAMUSCULAR | Status: DC | PRN

## 2013-12-23 MED ORDER — DIPHENHYDRAMINE HCL 50 MG/ML IJ SOLN: 12.5000 mg | INTRAMUSCULAR | Status: DC | PRN

## 2013-12-23 MED ORDER — MEPERIDINE HCL 25 MG/ML IJ SOLN
INTRAMUSCULAR | Status: DC | PRN
Start: 2013-12-23 — End: 2013-12-23
  Administered 2013-12-23 (×2): 12.5 mg via INTRAVENOUS

## 2013-12-23 SURGICAL SUPPLY — 35 items
APL SKNCLS STERI-STRIP NONHPOA (GAUZE/BANDAGES/DRESSINGS) ×1
BENZOIN TINCTURE PRP APPL 2/3 (GAUZE/BANDAGES/DRESSINGS) ×3 IMPLANT
CLAMP CORD UMBIL (MISCELLANEOUS) IMPLANT
CLOSURE WOUND 1/2 X4 (GAUZE/BANDAGES/DRESSINGS) ×1
CLOTH BEACON ORANGE TIMEOUT ST (SAFETY) ×3 IMPLANT
DRAPE LG THREE QUARTER DISP (DRAPES) IMPLANT
DRSG OPSITE POSTOP 4X10 (GAUZE/BANDAGES/DRESSINGS) ×3 IMPLANT
DURAPREP 26ML APPLICATOR (WOUND CARE) ×3 IMPLANT
ELECT REM PT RETURN 9FT ADLT (ELECTROSURGICAL) ×3
ELECTRODE REM PT RTRN 9FT ADLT (ELECTROSURGICAL) ×1 IMPLANT
EXTRACTOR VACUUM BELL STYLE (SUCTIONS) IMPLANT
GLOVE BIO SURGEON STRL SZ7 (GLOVE) ×3 IMPLANT
GOWN STRL REUS W/TWL LRG LVL3 (GOWN DISPOSABLE) ×6 IMPLANT
KIT ABG SYR 3ML LUER SLIP (SYRINGE) IMPLANT
NDL HYPO 25X5/8 SAFETYGLIDE (NEEDLE) IMPLANT
NEEDLE HYPO 25X5/8 SAFETYGLIDE (NEEDLE) IMPLANT
NS IRRIG 1000ML POUR BTL (IV SOLUTION) ×3 IMPLANT
PACK C SECTION WH (CUSTOM PROCEDURE TRAY) ×3 IMPLANT
PAD OB MATERNITY 4.3X12.25 (PERSONAL CARE ITEMS) ×3 IMPLANT
RETRACTOR WND ALEXIS 25 LRG (MISCELLANEOUS) ×1 IMPLANT
RTRCTR WOUND ALEXIS 25CM LRG (MISCELLANEOUS) ×3
STAPLER VISISTAT 35W (STAPLE) IMPLANT
STRIP CLOSURE SKIN 1/2X4 (GAUZE/BANDAGES/DRESSINGS) ×2 IMPLANT
SUT MNCRL 0 VIOLET CTX 36 (SUTURE) ×2 IMPLANT
SUT MONOCRYL 0 CTX 36 (SUTURE) ×6
SUT PDS AB 0 CTX 60 (SUTURE) IMPLANT
SUT PLAIN 2 0 XLH (SUTURE) ×3 IMPLANT
SUT VIC AB 0 CT1 27 (SUTURE) ×6
SUT VIC AB 0 CT1 27XBRD ANBCTR (SUTURE) ×2 IMPLANT
SUT VIC AB 2-0 CT1 27 (SUTURE) ×3
SUT VIC AB 2-0 CT1 TAPERPNT 27 (SUTURE) ×1 IMPLANT
SUT VIC AB 4-0 KS 27 (SUTURE) ×3 IMPLANT
TOWEL OR 17X24 6PK STRL BLUE (TOWEL DISPOSABLE) ×3 IMPLANT
TRAY FOLEY CATH 14FR (SET/KITS/TRAYS/PACK) ×1 IMPLANT
WATER STERILE IRR 1000ML POUR (IV SOLUTION) ×1 IMPLANT

## 2013-12-23 NOTE — Progress Notes (Signed)
Spouse concerned with changes in cognition. Noted mild pupil dilatation bilaterally, which is responsive to light. Speech clear and concise, but patient seems confused about events of the day. Anesthesia and OB notified.

## 2013-12-23 NOTE — Transfer of Care (Signed)
Immediate Anesthesia Transfer of Care Note  Patient: Krista Long  Procedure(s) Performed: Procedure(s): CESAREAN SECTION (N/A)  Patient Location: PACU  Anesthesia Type:Epidural  Level of Consciousness: awake, alert  and sedated  Airway & Oxygen Therapy: Patient Spontanous Breathing  Post-op Assessment: Report given to PACU RN and Post -op Vital signs reviewed and stable  Post vital signs: Reviewed and stable  Complications: No apparent anesthesia complications

## 2013-12-23 NOTE — Progress Notes (Signed)
Addendum to op note:  At time of closure of uterus, there was bleeding from the L corner of the incision that required 4 additional figure of eight stitches to ensure  Hemostasis.  The uterus had been removed from the peritoneal cavity to ensure that the stitches were anterior and not posterior.  Hemostasis had been achieved and blood loss was calculated but it is possible that more was lost than seen.  Pt had remained stable throughout.   Dr. Dareen PianoAnderson kept me informed of pt's lack of urine- 30 cc for 6 hours.  Pt received 1 liter bolus.  Now pt does have some orthostatics but is assymptomatic at rest, her H/H have been stable at about 8 and now pt has put out 75 cc in last 1:30.    Filed Vitals:   12/23/13 1237 12/23/13 1253 12/23/13 1255 12/23/13 1259  BP: 95/61 95/61 95/64  88/58  Pulse: 75 72 89 126  Temp: 98.9 F (37.2 C)  98 F (36.7 C)   TempSrc: Axillary  Oral   Resp: 18 18 18 18   Height:      Weight:      SpO2: 96% 96% 96%     Lungs CTA Cor slightly tachy, no MRG Abd slightly distended with gas but good bowel sounds and soft, normal tenderness and no rebound or guarding.  Incision is intact without hemotoma and only slight blood on dressing Ex some swelling, SCDs on Pelvic deferred for now.   Results for orders placed during the hospital encounter of 12/22/13 (from the past 48 hour(s))  OB RESULTS CONSOLE GBS     Status: None   Collection Time    12/22/13 12:00 AM      Result Value Ref Range   GBS Negative    TYPE AND SCREEN     Status: None   Collection Time    12/22/13  5:30 AM      Result Value Ref Range   ABO/RH(D) A POS     Antibody Screen NEG     Sample Expiration 12/25/2013    CBC     Status: Abnormal   Collection Time    12/22/13 11:00 PM      Result Value Ref Range   WBC 12.2 (*) 4.0 - 10.5 K/uL   RBC 4.18  3.87 - 5.11 MIL/uL   Hemoglobin 13.4  12.0 - 15.0 g/dL   HCT 40.937.5  81.136.0 - 91.446.0 %   MCV 89.7  78.0 - 100.0 fL   MCH 32.1  26.0 - 34.0 pg   MCHC  35.7  30.0 - 36.0 g/dL   RDW 78.212.9  95.611.5 - 21.315.5 %   Platelets 169  150 - 400 K/uL  RPR     Status: None   Collection Time    12/22/13 11:00 PM      Result Value Ref Range   RPR NON REAC  NON REAC   Comment: Performed at [facility]  CBC     Status: Abnormal   Collection Time    12/23/13  9:12 AM      Result Value Ref Range   WBC 15.7 (*) 4.0 - 10.5 K/uL   RBC 2.81 (*) 3.87 - 5.11 MIL/uL   Hemoglobin 8.8 (*) 12.0 - 15.0 g/dL   Comment: DELTA CHECK NOTED     REPEATED TO VERIFY   HCT 25.4 (*) 36.0 - 46.0 %   MCV 90.4  78.0 - 100.0 fL   MCH 31.3  26.0 -  34.0 pg   MCHC 34.6  30.0 - 36.0 g/dL   RDW 81.1  91.4 - 78.2 %   Platelets 161  150 - 400 K/uL  CBC     Status: Abnormal   Collection Time    12/23/13 12:15 PM      Result Value Ref Range   WBC 13.6 (*) 4.0 - 10.5 K/uL   RBC 2.59 (*) 3.87 - 5.11 MIL/uL   Hemoglobin 8.1 (*) 12.0 - 15.0 g/dL   HCT 95.6 (*) 21.3 - 08.6 %   MCV 90.0  78.0 - 100.0 fL   MCH 31.3  26.0 - 34.0 pg   MCHC 34.8  30.0 - 36.0 g/dL   RDW 57.8  46.9 - 62.9 %   Platelets 158  150 - 400 K/uL  BIRTH TISSUE RECOVERY COLLECTION (PLACENTA DONATION)     Status: None   Collection Time    12/23/13 12:15 PM      Result Value Ref Range   Placenta donation bld collect COLLECTED BY LABORATORY      I agree with Dr. Dareen Piano that a CT would at least rule out expanding hematoma in broad ligament or retroperitoneal.  Pt seems much more stable after bolus- I increased fluids to 200 cc/hour.  Now that the UOP has picked up, if radiology agrees, she should have contrast to look for bleeders and ureters.

## 2013-12-23 NOTE — Progress Notes (Signed)
Admission nutrition screen triggered for weight loss > 10 lbs.Patients chart reviewed and assessed  for nutritional risk. PNR record indicates no weight loss.  Patient is determined to be at low nutrition  risk.   Elisabeth CaraKatherine Emaline Karnes M.Odis LusterEd. R.D. LDN Neonatal Nutrition Support Specialist/RD III Pager [phone]

## 2013-12-23 NOTE — Consult Note (Signed)
Neonatology Note:   Attendance at C-section:    I was asked by Dr. Henderson CloudHorvath to attend this repeat C/S at term after TOLAC. The mother is a G4P2A1 A pos, GBS neg with onset of incision pain with contractions. ROM 6 hours prior to delivery, fluid clear. Infant vigorous with good spontaneous cry and tone. Needed only minimal bulb suctioning. Ap 8/9. Lungs clear to ausc in DR. 1-2/6 systolic murmur heard over LSB, radiating to the lung fields. Pulse oximeter placed, showed normal O2 saturations. Perfusion excellent. Informed parents and supervising nurse of murmur, probably transitional, and will check back in a few minutes. To CN to care of Pediatrician.   Doretha Souhristie C. Zinedine Ellner, MD

## 2013-12-23 NOTE — Progress Notes (Signed)
Patient admitted to floor around 0800. VS 92/62 121 18 97.4 and O2 sats 97%; small amount of drainage marked on honeycomb dressing.  Fundus firm and one above.  Lungs clear.  Small amount of urine in foley bag and amber in color.  Dr. Dareen PianoAnderson in room making rounds.  Informed him of vs, decreased urinary output and drainage on dressing.  Orders received for LR 500 cc bolus and CBC to be drawn. RN will continue to monitor.  Mother Baby RN- Emilio MathLanette Robinn [facility]

## 2013-12-23 NOTE — Progress Notes (Signed)
Pt was noted by husband and nursing staff to have some mental status changes over the last 4-6 hours. She is oriented x 4 but occ makes comments that are unrelated to the situation. When I was in the room the pt stated "that know that the water is broken what are we going to do". She received no narcotics today. She does have a scop patch on. She does have a h.o. Being disoriented with anesthetic meds.  Pt has normal motor and sensory function. Her pupils are dialated but react to light. VSSAF, good urine output. IMP/ New onset of mental status changes. Possibly from Scop patch. No neurologic deficit. Plan/ Will remove patch and reevaluate. If persists will ask for Neuro input.

## 2013-12-23 NOTE — Lactation Note (Signed)
This note was copied from the chart of Krista Donn PieriniDanielle Koller. Lactation Consultation Note     Initial consult with this mom of a term baby, now 5210 hours old. The baby has voided twice, and breast fed well, according to mom, four times . The baby was being held by a family member, asleep at this time. Lynette, RN, informed me that mom has had a difficult day, and may need to go back to surgery. I briefly reviewed lactation and community services with mom, she told me this baby is latching well, and she had no needs or questions, but mom knows to call for questions/concerns.   Patient Name: Krista Long ZOXWR'UToday's Date: 12/23/2013 Reason for consult: Initial assessment   Maternal Data Formula Feeding for Exclusion: No Infant to breast within first hour of birth: No Breastfeeding delayed due to:: Maternal status Has patient been taught Hand Expression?: Yes Does the patient have breastfeeding experience prior to this delivery?: Yes  Feeding    LATCH Score/Interventions                      Lactation Tools Discussed/Used     Consult Status Consult Status: Follow-up Date: 12/24/13 Follow-up type: In-patient    Alfred LevinsLee, Khaylee Mcevoy Anne 12/23/2013, 3:27 PM

## 2013-12-23 NOTE — Progress Notes (Signed)
Update on patients urine output, vitals and general status given to Dr. Dareen PianoAnderson by phone. Patient is requesting a regular diet. Orders received and explained plan of care to patient.

## 2013-12-23 NOTE — Progress Notes (Signed)
Updated Dr. Dareen PianoAnderson on lab results and patient condition. Orders received.

## 2013-12-23 NOTE — Brief Op Note (Signed)
12/22/2013 - 12/23/2013  5:09 AM  PATIENT:  Krista Long  28 y.o. female  PRE-OPERATIVE DIAGNOSIS:  previous cesarean section, maternal intolerance to labor  POST-OPERATIVE DIAGNOSIS:  previous cesarean section, maternal intolerance to labor  PROCEDURE:  Procedure(s): CESAREAN SECTION (N/A)  SURGEON:  Surgeon(s) and Role:    * Delecia Vastine A Laquetta Racey, MD - Primary    ANESTHESIA:   epidural  EBL:  Total I/O In: 1200 [I.V.:1200] Out: 1050 [Urine:450; Blood:600]   SPECIMEN:  No Specimen  DISPOSITION OF SPECIMEN:  N/A  COUNTS:  YES  TOURNIQUET:  * No tourniquets in log *  DICTATION: .Note written in EPIC  PLAN OF CARE: Admit to inpatient   PATIENT DISPOSITION:  PACU - hemodynamically stable.   Delay start of Pharmacological VTE agent (>24hrs) due to surgical blood loss or risk of bleeding: not applicable  Complications:  none Medications:  Ancef, Pitocin Findings:  Baby female, Apgars 8,9, weight P.   Normal tubes, ovaries and uterus seen.  Baby was skin to skin with mother after birth in the OR.  Technique:  Pt had been admitted earlier with early active labor and BPP of 4/10.  Although FHTs remained cat 1 during labor, pt was unable to get comfortable even after epidural.  Pt's skin C/S scar began burning and pt then decided on repeat c/s instead of proceeding with TOLAC.  After adequate epidural anesthesia was achieved, the patient was prepped and draped in usual sterile fashion.  A foley catheter was used to drain the bladder.  A pfannanstiel incision was made with the scalpel and carried down to the fascia with the bovie cautery. The fascia was incised in the midline with the scalpel and carried in a transverse curvilinear manner bilaterally.  The fascia was reflected superiorly and inferiorly off the rectus muscles and the muscles split in the midline.  A bowel free portion of the peritoneum was entered bluntly and then extended in a superior and inferior manner with good  visualization of the bowel and bladder.  The Alexis instrument was then placed and the vesico-uterine fascia tented up and incised in a transverse curvilinear manner.  A 2 cm transverse incision was made in the upper portion of the lower uterine segment until the amnion was exposed.   The incision was extended transversely in a blunt manner.  Clear fluid was noted and the baby delivered in the vertex presentation without complication.  The baby was bulb suctioned and the cord was clamped and cut.  The baby was then handed to awaiting Neonatology.  The placenta was then delivered manually and the uterus cleared of all debris.  The uterine incision was then closed with a running lock stitch of 0 monocryl.  An imbricating layer of 0 monocryl was closed as well. Excellent hemostasis of the uterine incision was achieved and the abdomen was cleared with irrigation.  The peritoneum was closed with a running stitch of 2-0 vicryl.  This incorporated the rectus muscles as a separate layer.  The fascia was then closed with a running stitch of 0 vicryl.  The subcutaneous layer was closed with interrupted  stitches of 2-0 plain gut.  The skin was closed with 4-0 vicryl on a Keith needle and steri-strips.  The patient tolerated the procedure well and was returned to the recovery room in stable condition.  All counts were correct times three.  Ahmari Duerson A   

## 2013-12-23 NOTE — Op Note (Signed)
12/22/2013 - 12/23/2013  5:09 AM  PATIENT:  Krista Long  28 y.o. female  PRE-OPERATIVE DIAGNOSIS:  previous cesarean section, maternal intolerance to labor  POST-OPERATIVE DIAGNOSIS:  previous cesarean section, maternal intolerance to labor  PROCEDURE:  Procedure(s): CESAREAN SECTION (N/A)  SURGEON:  Surgeon(s) and Role:    * Loney LaurenceMichelle A Katey Barrie, MD - Primary    ANESTHESIA:   epidural  EBL:  Total I/O In: 1200 [I.V.:1200] Out: 1050 [Urine:450; Blood:600]   SPECIMEN:  No Specimen  DISPOSITION OF SPECIMEN:  N/A  COUNTS:  YES  TOURNIQUET:  * No tourniquets in log *  DICTATION: .Note written in EPIC  PLAN OF CARE: Admit to inpatient   PATIENT DISPOSITION:  PACU - hemodynamically stable.   Delay start of Pharmacological VTE agent (>24hrs) due to surgical blood loss or risk of bleeding: not applicable  Complications:  none Medications:  Ancef, Pitocin Findings:  Baby female, Apgars 8,9, weight P.   Normal tubes, ovaries and uterus seen.  Baby was skin to skin with mother after birth in the OR.  Technique:  Pt had been admitted earlier with early active labor and BPP of 4/10.  Although FHTs remained cat 1 during labor, pt was unable to get comfortable even after epidural.  Pt's skin C/S scar began burning and pt then decided on repeat c/s instead of proceeding with TOLAC.  After adequate epidural anesthesia was achieved, the patient was prepped and draped in usual sterile fashion.  A foley catheter was used to drain the bladder.  A pfannanstiel incision was made with the scalpel and carried down to the fascia with the bovie cautery. The fascia was incised in the midline with the scalpel and carried in a transverse curvilinear manner bilaterally.  The fascia was reflected superiorly and inferiorly off the rectus muscles and the muscles split in the midline.  A bowel free portion of the peritoneum was entered bluntly and then extended in a superior and inferior manner with good  visualization of the bowel and bladder.  The Alexis instrument was then placed and the vesico-uterine fascia tented up and incised in a transverse curvilinear manner.  A 2 cm transverse incision was made in the upper portion of the lower uterine segment until the amnion was exposed.   The incision was extended transversely in a blunt manner.  Clear fluid was noted and the baby delivered in the vertex presentation without complication.  The baby was bulb suctioned and the cord was clamped and cut.  The baby was then handed to awaiting Neonatology.  The placenta was then delivered manually and the uterus cleared of all debris.  The uterine incision was then closed with a running lock stitch of 0 monocryl.  An imbricating layer of 0 monocryl was closed as well. Excellent hemostasis of the uterine incision was achieved and the abdomen was cleared with irrigation.  The peritoneum was closed with a running stitch of 2-0 vicryl.  This incorporated the rectus muscles as a separate layer.  The fascia was then closed with a running stitch of 0 vicryl.  The subcutaneous layer was closed with interrupted  stitches of 2-0 plain gut.  The skin was closed with 4-0 vicryl on a Keith needle and steri-strips.  The patient tolerated the procedure well and was returned to the recovery room in stable condition.  All counts were correct times three.  Samai Corea A

## 2013-12-23 NOTE — Progress Notes (Signed)
Pt had a repeat C/S this am and since that time she has not had a normal urine output. She also developed some right shoulder pain. Her pulse is slightly elevated. Her incision has minimal drainage and her abd in non tender, non distended. Her Hgb preop was 13.4. At 9am it was 8.8 and was just repeated, 8.1. She has had 2 fluid boluses without a change in urine output. IMP/ S/P repeat C/S with decreased urine output most likely secondary to anemia. Hgb seems stable at this time.  Plan/  Will check CT scan.             Repeat Hgb in six hours.

## 2013-12-23 NOTE — Anesthesia Postprocedure Evaluation (Signed)
  Anesthesia Post-op Note  Patient: Krista Long  Procedure(s) Performed: Procedure(s): CESAREAN SECTION (N/A)  Patient Location: PACU  Anesthesia Type:Epidural  Level of Consciousness: awake, alert  and oriented  Airway and Oxygen Therapy: Patient Spontanous Breathing  Post-op Pain: none  Post-op Assessment: Post-op Vital signs reviewed, Patient's Cardiovascular Status Stable, Respiratory Function Stable, Patent Airway, No signs of Nausea or vomiting, Pain level controlled, No headache and No backache  Post-op Vital Signs: Reviewed and stable  Last Vitals:  Filed Vitals:   12/23/13 0645  BP: 84/61  Pulse: 113  Temp:   Resp: 13    Complications: No apparent anesthesia complications

## 2013-12-24 ENCOUNTER — Encounter (HOSPITAL_COMMUNITY): Payer: Self-pay | Admitting: Obstetrics and Gynecology

## 2013-12-24 LAB — CBC
HEMATOCRIT: 20.1 % — AB (ref 36.0–46.0)
Hemoglobin: 6.8 g/dL — CL (ref 12.0–15.0)
MCH: 30.9 pg (ref 26.0–34.0)
MCHC: 33.8 g/dL (ref 30.0–36.0)
MCV: 91.4 fL (ref 78.0–100.0)
Platelets: 148 10*3/uL — ABNORMAL LOW (ref 150–400)
RBC: 2.2 MIL/uL — ABNORMAL LOW (ref 3.87–5.11)
RDW: 13.1 % (ref 11.5–15.5)
WBC: 11.4 10*3/uL — ABNORMAL HIGH (ref 4.0–10.5)

## 2013-12-24 LAB — HEMOGLOBIN AND HEMATOCRIT, BLOOD
HCT: 18 % — ABNORMAL LOW (ref 36.0–46.0)
HEMOGLOBIN: 6.1 g/dL — AB (ref 12.0–15.0)

## 2013-12-24 LAB — PREPARE RBC (CROSSMATCH)

## 2013-12-24 MED ORDER — DIPHENHYDRAMINE HCL 25 MG PO CAPS
25.0000 mg | ORAL_CAPSULE | Freq: Once | ORAL | Status: AC
  Administered 2013-12-24: 25 mg via ORAL
  Filled 2013-12-24: qty 1

## 2013-12-24 MED ORDER — ACETAMINOPHEN 325 MG PO TABS
650.0000 mg | ORAL_TABLET | Freq: Once | ORAL | Status: AC
  Administered 2013-12-24: 325 mg via ORAL
  Filled 2013-12-24: qty 2

## 2013-12-24 MED ORDER — SODIUM CHLORIDE 0.9 % IV SOLN
INTRAVENOUS | Status: DC
  Administered 2013-12-24: 10 mL via INTRAVENOUS

## 2013-12-24 NOTE — Addendum Note (Signed)
Addendum created 12/24/13 0756 by Lincoln BrighamAngela Draughon Myka Hitz, CRNA   Modules edited: Notes Section   Notes Section:  File: [id number]

## 2013-12-24 NOTE — Anesthesia Postprocedure Evaluation (Signed)
  Anesthesia Post-op Note  Patient: Krista Long  Procedure(s) Performed: Procedure(s): CESAREAN SECTION (N/A)  Patient Location: mother baby  Anesthesia Type:Epidural  Level of Consciousness: awake, alert  and oriented  Airway and Oxygen Therapy: Patient Spontanous Breathing  Post-op Pain: mild  Post-op Assessment: Patient's Cardiovascular Status Stable, Respiratory Function Stable, Patent Airway, No signs of Nausea or vomiting, Pain level controlled, No headache, No backache, No residual numbness and No residual motor weakness  Post-op Vital Signs: stable  Last Vitals:  Filed Vitals:   12/24/13 0435  BP: 100/64  Pulse: 92  Temp: 36.4 C  Resp: 18    Complications: No apparent anesthesia complications

## 2013-12-24 NOTE — Progress Notes (Signed)
CRITICAL VALUE ALERT  Critical value received:  Hgb 6.1 Date of notification:  12/24/13  Time of notification:  1240   Critical value read back:Yes.    Nurse who received alert: Lafonda Mossesonna Ward Boissonneault  MD notified (1st page):  1225  Time of first page:n/a   MD notified (2nd page):n/a  Time of second page:n/a  Responding MD:  Dr Mora ApplPinn  Time MD responded:  1240

## 2013-12-24 NOTE — Lactation Note (Signed)
This note was copied from the chart of Krista Long. Lactation Consultation Note  Patient Name: Krista Long QMVHQ'IToday's Date: 12/24/2013 Reason for consult: Follow-up assessment Mom is process of getting blood transfusion and has company. She reports baby is latching well to 1 breast but is having trouble with the other and would like some assist. LC advised Mom to call with the next feeding as baby recently fed and see if LC could come observe and assist. Discussed pumping with Mom due to Hgb 6.1, Mom has pump at home and advised she is observing colostrum with hand expression. Mom will consider and advise. Encouraged Mom to call with next feeding for assist.   Maternal Data    Feeding Feeding Type: Breast Fed Length of feed: 20 min  LATCH Score/Interventions                      Lactation Tools Discussed/Used     Consult Status Consult Status: Follow-up Date: 12/24/13 Follow-up type: In-patient    Alfred LevinsGranger, Edith Groleau Ann 12/24/2013, 6:19 PM

## 2013-12-24 NOTE — Progress Notes (Signed)
Subjective: Postpartum Day 1: Cesarean Delivery Patient reports incisional pain, tolerating PO, + flatus and no problems voiding.   Overnight there was concern about her blood count. Patient notes ambulating without difficulty, no Chest pain or shortness of breath. She denies feeling dizzy with walking or faint.  She is breast feeding.   Objective: Vital signs in last 24 hours: Temp:  [97.3 F (36.3 C)-98.9 F (37.2 C)] 97.6 F (36.4 C) (07/10 0435) Pulse Rate:  [72-126] 92 (07/10 0435) Resp:  [18-20] 18 (07/10 0435) BP: (88-115)/(57-71) 100/64 mmHg (07/10 0435) SpO2:  [96 %-99 %] 98 % (07/10 0435)  Physical Exam:  General: alert, cooperative and no distress Lochia: appropriate Uterine Fundus: firm Abd: Soft, appropriately tender, mild distention. NABS Incision: healing well, no significant drainage, no dehiscence, no significant erythema DVT Evaluation: No evidence of DVT seen on physical exam. Negative Homan's sign. No cords or calf tenderness.   Recent Labs  12/23/13 1711 12/24/13 0605  HGB 7.1* 6.8*  HCT 21.0* 20.1*   Recent Hb this am: 6.8  Assessment/Plan: Status post Cesarean section. CT ABD / Pelvis done yesterday afternoon shows Collection of blood in paracolic gutters which is normal post operative cesarean section, small collection Around left adnexa. No e/o active bleed. Hb 6.8 this am. Patient however doing well, she is not feeling s/sx of anemia Will re-draw Hb/HCT this pm and if lower will offer transfusion, if stable will d/c IV and give PO iron   Continue current care.  Krista Long, Krista Long 12/24/2013, 8:45 AM

## 2013-12-25 LAB — TYPE AND SCREEN
ABO/RH(D): A POS
ANTIBODY SCREEN: NEGATIVE
UNIT DIVISION: 0
Unit division: 0
Unit division: 0

## 2013-12-25 LAB — HEMOGLOBIN AND HEMATOCRIT, BLOOD
HCT: 28.1 % — ABNORMAL LOW (ref 36.0–46.0)
HEMOGLOBIN: 9.6 g/dL — AB (ref 12.0–15.0)

## 2013-12-25 MED ORDER — FERROUS SULFATE 325 (65 FE) MG PO TABS: 325.0000 mg | ORAL_TABLET | Freq: Two times a day (BID) | ORAL | Status: DC

## 2013-12-25 MED ORDER — IBUPROFEN 600 MG PO TABS: 600.0000 mg | ORAL_TABLET | Freq: Four times a day (QID) | ORAL | Status: DC | PRN

## 2013-12-25 MED ORDER — BISACODYL 10 MG RE SUPP: 10.0000 mg | Freq: Every day | RECTAL | Status: DC | PRN

## 2013-12-25 MED ORDER — OXYCODONE-ACETAMINOPHEN 5-325 MG PO TABS: 1.0000 | ORAL_TABLET | ORAL | Status: DC | PRN

## 2013-12-25 NOTE — Discharge Summary (Signed)
Obstetric Discharge Summary Reason for Admission: onset of labor Prenatal Procedures: none Intrapartum Procedures: cesarean: low cervical, transverse Postpartum Procedures: transfusion 3 units Complications-Operative and Postpartum: hemorrhage Hemoglobin  Date Value Ref Range Status  12/25/2013 9.6* 12.0 - 15.0 g/dL Final     DELTA CHECK NOTED     POST TRANSFUSION SPECIMEN     REPEATED TO VERIFY     HCT  Date Value Ref Range Status  12/25/2013 28.1* 36.0 - 46.0 % Final    Physical Exam:  General: alert, cooperative and no distress Lochia: appropriate Uterine Fundus: firm Incision: healing well, no significant drainage, no dehiscence, no significant erythema DVT Evaluation: No evidence of DVT seen on physical exam. Negative Homan's sign. No cords or calf tenderness.  Discharge Diagnoses: Term Pregnancy-delivered  Discharge Information: Date: 12/25/2013 Activity: pelvic rest Diet: routine Medications: Ibuprofen, Colace, Iron and Percocet Condition: stable Instructions: refer to practice specific booklet Discharge to: home   Newborn Data: Live born female  Birth Weight: 7 lb 6 oz (3345 g) APGAR: 8, 9  Home with mother.  Krista HartINN, Krista Long 12/25/2013, 12:42 PM

## 2013-12-25 NOTE — Progress Notes (Signed)
Subjective: Postpartum Day 2: Cesarean Delivery Patient reports incisional pain, tolerating PO, + flatus and no problems voiding.    Objective: Vital signs in last 24 hours: Temp:  [97.4 F (36.3 C)-98.6 F (37 C)] 97.9 F (36.6 C) (07/11 0456) Pulse Rate:  [73-105] 73 (07/11 0456) Resp:  [16-18] 18 (07/11 0456) BP: (101-118)/(52-73) 101/67 mmHg (07/11 0456) SpO2:  [98 %-100 %] 99 % (07/11 0030)  Physical Exam:  General: alert, cooperative and no distress Lochia: appropriate Uterine Fundus: firm Incision: healing well, no significant drainage, no dehiscence, no significant erythema DVT Evaluation: No evidence of DVT seen on physical exam. Negative Homan's sign. No cords or calf tenderness.   Recent Labs  12/24/13 1159 12/25/13 0320  HGB 6.1* 9.6*  HCT 18.0* 28.1*    Assessment/Plan: Status post Cesarean section. Doing well postoperatively.  Discharge home with standard precautions and return to office for post op follow up 7-10 days   Essie HartINN, Dorann Davidson STACIA 12/25/2013, 12:37 PM

## 2013-12-25 NOTE — Lactation Note (Signed)
This note was copied from the chart of Girl Donn PieriniDanielle Pokorski. Lactation Consultation Note: Called to see mom. Both nipples with positional stripes noted. Baby cluster fed through the night. Suggested change positions a few times throughout the day so baby wasn't latched on the exact same way every time. Comfort gels given with instructions for use. Mom reports they feel great. Baby asleep at present. Reports that breasts are feeling a little fuller this morning. To call for assist when baby wakes for next feeding. No questions at present.   Patient Name: Girl Donn PieriniDanielle Apodaca [id number]'XToday's Date: 12/25/2013 Reason for consult: Follow-up assessment   Maternal Data Formula Feeding for Exclusion: No Does the patient have breastfeeding experience prior to this delivery?: Yes  Feeding   LATCH Score/Interventions          Comfort (Breast/Nipple): Filling, red/small blisters or bruises, mild/mod discomfort  Problem noted: Mild/Moderate discomfort Interventions (Mild/moderate discomfort): Comfort gels;Hand expression        Lactation Tools Discussed/Used Tools: Comfort gels   Consult Status Consult Status: Complete    Pamelia HoitWeeks, Dinia Joynt D 12/25/2013, 10:34 AM

## 2013-12-27 ENCOUNTER — Encounter (HOSPITAL_COMMUNITY): Payer: Self-pay | Admitting: *Deleted

## 2013-12-27 ENCOUNTER — Inpatient Hospital Stay (HOSPITAL_COMMUNITY)
Admission: AD | Admit: 2013-12-27 | Discharge: 2013-12-29 | DRG: 776 | Disposition: A | Discharge status: Home / Self Care | Payer: BC Managed Care – PPO | Source: Ambulatory Visit | Attending: Obstetrics & Gynecology | Admitting: Obstetrics & Gynecology

## 2013-12-27 DIAGNOSIS — N719 Inflammatory disease of uterus, unspecified: Secondary | ICD-10-CM | POA: Diagnosis present

## 2013-12-27 DIAGNOSIS — O8612 Endometritis following delivery: Principal | ICD-10-CM | POA: Diagnosis present

## 2013-12-27 LAB — COMPREHENSIVE METABOLIC PANEL
ALK PHOS: 117 U/L (ref 39–117)
ALT: 22 U/L (ref 0–35)
ANION GAP: 13 (ref 5–15)
AST: 25 U/L (ref 0–37)
Albumin: 2.1 g/dL — ABNORMAL LOW (ref 3.5–5.2)
BILIRUBIN TOTAL: 1.7 mg/dL — AB (ref 0.3–1.2)
BUN: 10 mg/dL (ref 6–23)
CHLORIDE: 100 meq/L (ref 96–112)
CO2: 22 mEq/L (ref 19–32)
Calcium: 8.7 mg/dL (ref 8.4–10.5)
Creatinine, Ser: 0.54 mg/dL (ref 0.50–1.10)
GFR calc Af Amer: >90 mL/min (ref >90)
GFR calc non Af Amer: >90 mL/min (ref >90)
Glucose, Bld: 96 mg/dL (ref 70–99)
POTASSIUM: 3.6 meq/L — AB (ref 3.7–5.3)
SODIUM: 135 meq/L — AB (ref 137–147)
Total Protein: 5.8 g/dL — ABNORMAL LOW (ref 6.0–8.3)

## 2013-12-27 LAB — URINALYSIS, ROUTINE W REFLEX MICROSCOPIC
Bilirubin Urine: NEGATIVE
GLUCOSE, UA: NEGATIVE mg/dL
Ketones, ur: NEGATIVE mg/dL
Nitrite: NEGATIVE
PH: 7.5 (ref 5.0–8.0)
Protein, ur: NEGATIVE mg/dL
SPECIFIC GRAVITY, URINE: 1.01 (ref 1.005–1.030)
UROBILINOGEN UA: 1 mg/dL (ref 0.0–1.0)

## 2013-12-27 LAB — URINE MICROSCOPIC-ADD ON

## 2013-12-27 LAB — CBC
HEMATOCRIT: 30.2 % — AB (ref 36.0–46.0)
Hemoglobin: 10.5 g/dL — ABNORMAL LOW (ref 12.0–15.0)
MCH: 31.3 pg (ref 26.0–34.0)
MCHC: 34.8 g/dL (ref 30.0–36.0)
MCV: 89.9 fL (ref 78.0–100.0)
PLATELETS: 157 10*3/uL (ref 150–400)
RBC: 3.36 MIL/uL — ABNORMAL LOW (ref 3.87–5.11)
RDW: 13.4 % (ref 11.5–15.5)
WBC: 8.6 10*3/uL (ref 4.0–10.5)

## 2013-12-27 NOTE — MAU Provider Note (Signed)
History     CSN: [id number]  Arrival date and time: 12/27/13 2147   First Provider Initiated Contact with Patient 12/27/13 2229      Chief Complaint  Patient presents with  . Fever  . Post-op Problem   HPI  Arva ChafeDanielle A Kurdziel is a 28 y.o. [phone] who is S/P c-section on 12/23/13. She states that at 2100 she had a temp of 102 at home. She last took percocet at [facility]. However, she has had chills all day and wasn't feeling well, but did not check her temp until this evening. She is breastfeeding. She denies any breast pain. She denies any abdominal pain. She states that she is sore on her incision, but that is does feel "different" than before.   Past Medical History  Diagnosis Date  . Hyperthyroidism     Past Surgical History  Procedure Laterality Date  . Cesarean section    . Dilation and curettage of uterus    . Cholecystectomy    . Cesarean section N/A 12/23/2013    Procedure: CESAREAN SECTION;  Surgeon: Loney LaurenceMichelle A Horvath, MD;  Location: [facility];  Service: Obstetrics;  Laterality: N/A;    History reviewed. No pertinent family history.  History  Substance Use Topics  . Smoking status: Never Smoker   . Smokeless tobacco: Never Used  . Alcohol Use: No    Allergies: Not on File  Prescriptions prior to admission  Medication Sig Dispense Refill  . bisacodyl (DULCOLAX) 10 MG suppository Place 1 suppository (10 mg total) rectally daily as needed for moderate constipation.  12 suppository  0  . ferrous sulfate 325 (65 FE) MG tablet Take 1 tablet (325 mg total) by mouth 2 (two) times daily with a meal.  90 tablet  3  . ibuprofen (ADVIL,MOTRIN) 600 MG tablet Take 1 tablet (600 mg total) by mouth every 6 (six) hours as needed for moderate pain.  90 tablet  3  . OVER THE COUNTER MEDICATION Take 2 capsules by mouth 3 (three) times daily. Over the counter herbal medication called Red Raspberry Leaf Tea.      Marland Kitchen. oxyCODONE-acetaminophen (PERCOCET/ROXICET) 5-325 MG per tablet Take 1-2  tablets by mouth every 4 (four) hours as needed for severe pain (moderate - severe pain).  60 tablet  0  . Prenatal Vit-Fe Fumarate-FA (PRENATAL MULTIVITAMIN) TABS tablet Take 1 tablet by mouth daily at 12 noon.      . ranitidine (ZANTAC) 150 MG tablet Take 150 mg by mouth daily as needed for heartburn.        ROS Physical Exam   Blood pressure 130/80, pulse 116, temperature 100.3 F (37.9 C), temperature source Oral, resp. rate 18, height 4' 11.75" (1.518 m), weight 70.58 kg (155 lb 9.6 oz), currently breastfeeding.  Physical Exam  Nursing note and vitals reviewed. Constitutional: She is oriented to person, place, and time. She appears well-developed and well-nourished. No distress.  Cardiovascular: Normal rate.   Respiratory: Effort normal.  Breasts: soft, non-tender Both nipples slightly abraded   GI: Soft. There is tenderness (at the uterine fundus ). There is no rebound.  bruising on abdomen above incision   Neurological: She is alert and oriented to person, place, and time.  Skin: Skin is warm and dry.  Psychiatric: She has a normal mood and affect.    MAU Course  Procedures  Results for orders placed during the hospital encounter of 12/27/13 (from the past 24 hour(s))  URINALYSIS, ROUTINE W REFLEX MICROSCOPIC  Status: Abnormal   Collection Time    12/27/13 10:00 PM      Result Value Ref Range   Color, Urine YELLOW  YELLOW   APPearance CLEAR  CLEAR   Specific Gravity, Urine 1.010  1.005 - 1.030   pH 7.5  5.0 - 8.0   Glucose, UA NEGATIVE  NEGATIVE mg/dL   Hgb urine dipstick LARGE (*) NEGATIVE   Bilirubin Urine NEGATIVE  NEGATIVE   Ketones, ur NEGATIVE  NEGATIVE mg/dL   Protein, ur NEGATIVE  NEGATIVE mg/dL   Urobilinogen, UA 1.0  0.0 - 1.0 mg/dL   Nitrite NEGATIVE  NEGATIVE   Leukocytes, UA SMALL (*) NEGATIVE  URINE MICROSCOPIC-ADD ON     Status: Abnormal   Collection Time    12/27/13 10:00 PM      Result Value Ref Range   Squamous Epithelial / LPF FEW (*) RARE    WBC, UA 3-6  <3 WBC/hpf   RBC / HPF 3-6  <3 RBC/hpf   Bacteria, UA FEW (*) RARE  CBC     Status: Abnormal   Collection Time    12/27/13 10:41 PM      Result Value Ref Range   WBC 8.6  4.0 - 10.5 K/uL   RBC 3.36 (*) 3.87 - 5.11 MIL/uL   Hemoglobin 10.5 (*) 12.0 - 15.0 g/dL   HCT 16.1 (*) 09.6 - 04.5 %   MCV 89.9  78.0 - 100.0 fL   MCH 31.3  26.0 - 34.0 pg   MCHC 34.8  30.0 - 36.0 g/dL   RDW 40.9  81.1 - 91.4 %   Platelets 157  150 - 400 K/uL  COMPREHENSIVE METABOLIC PANEL     Status: Abnormal   Collection Time    12/27/13 10:41 PM      Result Value Ref Range   Sodium 135 (*) 137 - 147 mEq/L   Potassium 3.6 (*) 3.7 - 5.3 mEq/L   Chloride 100  96 - 112 mEq/L   CO2 22  19 - 32 mEq/L   Glucose, Bld 96  70 - 99 mg/dL   BUN 10  6 - 23 mg/dL   Creatinine, Ser 7.82  0.50 - 1.10 mg/dL   Calcium 8.7  8.4 - 95.6 mg/dL   Total Protein 5.8 (*) 6.0 - 8.3 g/dL   Albumin 2.1 (*) 3.5 - 5.2 g/dL   AST 25  0 - 37 U/L   ALT 22  0 - 35 U/L   Alkaline Phosphatase 117  39 - 117 U/L   Total Bilirubin 1.7 (*) 0.3 - 1.2 mg/dL   GFR calc non Af Amer >90  >90 mL/min   GFR calc Af Amer >90  >90 mL/min   Anion gap 13  5 - 15   2308: D/W Dr. Claiborne Billings will watch for 1-2 hours to see if temp increases. Will also send a UA  0108: D/W Dr. Claiborne Billings patient's temp is now 100.3. Orders for admission given  Assessment and Plan   1. Endometritis following delivery    Admit to 3rd floor Gent and clinda Repeat CBC in AM   Tawnya Crook 12/28/2013, 1:08 AM

## 2013-12-27 NOTE — MAU Note (Addendum)
Pt had repeat C/S on 12-23-13.  Received blood transfusion on 12-24-13.  Pt states she spiked a temp today of 102.  She hasn't taken any meds for temp.  Pt was advised by office to come to MAU.  Moderate amount of bruising above C/S site on the right side and slightly warm to touch.  Pt states she has been having some headaches, last one this morning.  None now, but states they are at the base of her head and she states she "see's smoke" when it occurs. She had several yesterday as well.

## 2013-12-28 ENCOUNTER — Encounter (HOSPITAL_COMMUNITY): Payer: Self-pay | Admitting: *Deleted

## 2013-12-28 DIAGNOSIS — O8612 Endometritis following delivery: Secondary | ICD-10-CM | POA: Diagnosis present

## 2013-12-28 HISTORY — DX: Endometritis following delivery: O86.12

## 2013-12-28 LAB — CBC WITH DIFFERENTIAL/PLATELET
BASOS PCT: 0 % (ref 0–1)
Basophils Absolute: 0 10*3/uL (ref 0.0–0.1)
EOS PCT: 1 % (ref 0–5)
Eosinophils Absolute: 0.1 10*3/uL (ref 0.0–0.7)
HEMATOCRIT: 32.2 % — AB (ref 36.0–46.0)
Hemoglobin: 11.1 g/dL — ABNORMAL LOW (ref 12.0–15.0)
Lymphocytes Relative: 15 % (ref 12–46)
Lymphs Abs: 1.8 10*3/uL (ref 0.7–4.0)
MCH: 31.3 pg (ref 26.0–34.0)
MCHC: 34.5 g/dL (ref 30.0–36.0)
MCV: 90.7 fL (ref 78.0–100.0)
MONO ABS: 1.6 10*3/uL — AB (ref 0.1–1.0)
Monocytes Relative: 14 % — ABNORMAL HIGH (ref 3–12)
Neutro Abs: 8.1 10*3/uL — ABNORMAL HIGH (ref 1.7–7.7)
Neutrophils Relative %: 70 % (ref 43–77)
Platelets: 191 10*3/uL (ref 150–400)
RBC: 3.55 MIL/uL — ABNORMAL LOW (ref 3.87–5.11)
RDW: 13.5 % (ref 11.5–15.5)
WBC: 11.5 10*3/uL — ABNORMAL HIGH (ref 4.0–10.5)

## 2013-12-28 MED ORDER — DOCUSATE SODIUM 100 MG PO CAPS
100.0000 mg | ORAL_CAPSULE | Freq: Every day | ORAL | Status: DC
  Administered 2013-12-28 – 2013-12-29 (×2): 100 mg via ORAL
  Filled 2013-12-28 (×2): qty 1

## 2013-12-28 MED ORDER — CLINDAMYCIN PHOSPHATE 900 MG/50ML IV SOLN
900.0000 mg | Freq: Three times a day (TID) | INTRAVENOUS | Status: DC
  Filled 2013-12-28 (×2): qty 50

## 2013-12-28 MED ORDER — IBUPROFEN 600 MG PO TABS
600.0000 mg | ORAL_TABLET | Freq: Four times a day (QID) | ORAL | Status: DC | PRN
  Administered 2013-12-28 – 2013-12-29 (×3): 600 mg via ORAL
  Filled 2013-12-28 (×3): qty 1

## 2013-12-28 MED ORDER — FERROUS SULFATE 325 (65 FE) MG PO TABS
325.0000 mg | ORAL_TABLET | Freq: Two times a day (BID) | ORAL | Status: DC
  Administered 2013-12-28 (×2): 325 mg via ORAL
  Filled 2013-12-28 (×3): qty 1

## 2013-12-28 MED ORDER — PRENATAL MULTIVITAMIN CH
1.0000 | ORAL_TABLET | Freq: Every day | ORAL | Status: DC
  Administered 2013-12-28 – 2013-12-29 (×2): 1 via ORAL
  Filled 2013-12-28 (×2): qty 1

## 2013-12-28 MED ORDER — SODIUM CHLORIDE 0.9 % IV SOLN
INTRAVENOUS | Status: DC
  Administered 2013-12-28 – 2013-12-29 (×4): via INTRAVENOUS

## 2013-12-28 MED ORDER — CALCIUM CARBONATE ANTACID 500 MG PO CHEW: 2.0000 | CHEWABLE_TABLET | ORAL | Status: DC | PRN

## 2013-12-28 MED ORDER — OXYCODONE-ACETAMINOPHEN 5-325 MG PO TABS
1.0000 | ORAL_TABLET | ORAL | Status: DC | PRN
  Administered 2013-12-28 – 2013-12-29 (×5): 1 via ORAL
  Filled 2013-12-28 (×5): qty 1

## 2013-12-28 MED ORDER — GENTAMICIN SULFATE 40 MG/ML IJ SOLN
Freq: Three times a day (TID) | INTRAVENOUS | Status: DC
  Administered 2013-12-28 – 2013-12-29 (×5): via INTRAVENOUS
  Filled 2013-12-28 (×5): qty 3.75

## 2013-12-28 NOTE — Consult Note (Signed)
Lactation consult - Mom a readmit for endomteitis. Baby is with her and breast feeding, and mom is doing some pumping. Mom denies needing any lactation assistance at this, but knows she can call for lactation prn.

## 2013-12-28 NOTE — Progress Notes (Signed)
ANTIBIOTIC CONSULT NOTE - INITIAL  Pharmacy Consult for Gentamicin Indication: Endometritis  Not on File  Patient Measurements: Height: 4' 11.75" (151.8 cm) Weight: 155 lb 9.6 oz (70.58 kg) IBW/kg (Calculated) : 44.93 Adjusted Body Weight: 53 kg  Vital Signs: Temp: 100.3 F (37.9 C) (07/14 0054) Temp src: Oral (07/13 2212) BP: 115/67 mmHg (07/14 0154) Pulse Rate: 107 (07/14 0154)  Labs:  Recent Labs  12/25/13 0320 12/27/13 2241  WBC  --  8.6  HGB 9.6* 10.5*  PLT  --  157  CREATININE  --  0.54    Medications:  Clindamycin 900 mg IV Q8h  Assessment: 28 y.o. female [id number] S/P repeat C-section on 7/9 presenting with tmax of 102, chills, and tender abdomen. Being initiated on gentamicin/clindamycin for endometritis.   Estimated Ke = 0.33, Vd = 21.2L  Goal of Therapy:  Gentamicin peak 6-8 mg/L and Trough < 1 mg/L  Plan:  Gentamicin 150 mg IV every 8 hrs  Check Scr with next labs if gentamicin continued. Will check gentamicin levels if continued > 72hr or clinically indicated.  Krista GrosHolcombe, Krista Long Krista Long 12/28/2013,2:34 AM

## 2013-12-28 NOTE — Progress Notes (Signed)
  Patient is eating, ambulating, voiding.  Pain control is good.  Filed Vitals:   12/28/13 0200 12/28/13 0320 12/28/13 0531 12/28/13 0617  BP: 113/66  114/72   Pulse: 109  94   Temp: 100.3 F (37.9 C) 98.7 F (37.1 C)  98.5 F (36.9 C)  TempSrc: Oral Oral  Oral  Resp: 18  18   Height:      Weight:      SpO2: 98%  100%     lungs:   clear to auscultation cor:    RRR Abdomen:  soft, appropriate tenderness, incisions intact and without erythema or exudate ex:    no cords   Lab Results  Component Value Date   WBC 11.5* 12/28/2013   HGB 11.1* 12/28/2013   HCT 32.2* 12/28/2013   MCV 90.7 12/28/2013   PLT 191 12/28/2013     A/P    Post operative day 5; pp hemorrhage and now readmitted for endometritis.  On Amp and gent now and afeb since midnight.  Percocet for pain control.

## 2013-12-29 DIAGNOSIS — N719 Inflammatory disease of uterus, unspecified: Secondary | ICD-10-CM

## 2013-12-29 HISTORY — DX: Inflammatory disease of uterus, unspecified: N71.9

## 2013-12-29 MED ORDER — CLINDAMYCIN HCL 300 MG PO CAPS: 300.0000 mg | ORAL_CAPSULE | Freq: Three times a day (TID) | ORAL | Status: AC

## 2013-12-29 NOTE — Discharge Summary (Signed)
Physician Discharge Summary  Patient ID: Krista Long MRN: [id number] DOB/AGE: 29-12-1985 29 y.o.  Admit date: 12/27/2013 Discharge date: 12/29/2013  Admission Diagnoses:  Discharge Diagnoses:  Active Problems:   Endometritis following delivery   Myometritis   Discharged Condition: good  Hospital Course: Patient re-admitted to hospital for post partum fever and pain, concern for endomyometritis. She received antibiotic therapy,  Remained afebrile for more than 24 hours and then was sent home with po antibiotics  Consults: None  Significant Diagnostic Studies: labs: WBC: 11  Treatments: IV hydration and antibiotics  Discharge Exam: Blood pressure 120/74, pulse 89, temperature 97.8 F (36.6 C), temperature source Oral, resp. rate 18, height 4' 11.75" (1.518 m), weight 70.58 kg (155 lb 9.6 oz), SpO2 99.00%, currently breastfeeding.  General: alert, cooperative and no distress  Lochia: appropriate  Uterine Fundus: firm  Abdomen: Soft, mild distension, appropriate tenderness, no rebound no guarding.  Diffuse ecchymosis.  Incision: healing well, no significant drainage, no dehiscence, no significant erythema  DVT Evaluation: No evidence of DVT seen on physical exam.  Negative Homan's sign.  No cords or calf tenderness.  Disposition: 01-Home or Self Care  Discharge Instructions   Call MD for:  difficulty breathing, headache or visual disturbances    Complete by:  As directed      Call MD for:  extreme fatigue    Complete by:  As directed      Call MD for:  hives    Complete by:  As directed      Call MD for:  persistant dizziness or light-headedness    Complete by:  As directed      Call MD for:  persistant nausea and vomiting    Complete by:  As directed      Call MD for:  redness, tenderness, or signs of infection (pain, swelling, redness, odor or green/yellow discharge around incision site)    Complete by:  As directed      Call MD for:  severe uncontrolled pain     Complete by:  As directed      Call MD for:  temperature >100.4    Complete by:  As directed      Diet - low sodium heart healthy    Complete by:  As directed      Discharge instructions    Complete by:  As directed   Call the office if you have any questions or concerns     Lifting restrictions    Complete by:  As directed   Weight restriction of 20 lbs.     Remove dressing in 48 hours    Complete by:  As directed             Medication List         bisacodyl 10 MG suppository  Commonly known as:  DULCOLAX  Place 1 suppository (10 mg total) rectally daily as needed for moderate constipation.     clindamycin 300 MG capsule  Commonly known as:  CLEOCIN  Take 1 capsule (300 mg total) by mouth 3 (three) times daily.     ferrous sulfate 325 (65 FE) MG tablet  Take 1 tablet (325 mg total) by mouth 2 (two) times daily with a meal.     ibuprofen 600 MG tablet  Commonly known as:  ADVIL,MOTRIN  Take 1 tablet (600 mg total) by mouth every 6 (six) hours as needed for moderate pain.     OVER THE COUNTER MEDICATION  Take 2  capsules by mouth 3 (three) times daily. Over the counter herbal medication called Red Raspberry Leaf Tea.     oxyCODONE-acetaminophen 5-325 MG per tablet  Commonly known as:  PERCOCET/ROXICET  Take 1-2 tablets by mouth every 4 (four) hours as needed for severe pain (moderate - severe pain).     prenatal multivitamin Tabs tablet  Take 1 tablet by mouth daily at 12 noon.     ranitidine 150 MG tablet  Commonly known as:  ZANTAC  Take 150 mg by mouth daily as needed for heartburn.         SignedWynonia Hazard: Fawnda Vitullo STACIA 12/29/2013, 12:13 PM

## 2013-12-29 NOTE — Progress Notes (Signed)
Ur chart review completed.  

## 2013-12-29 NOTE — Progress Notes (Signed)
Discharge instructions reviewed with patient.  Patient states understanding of home care, activity, medications, signs/symptoms to report to MD and return MD office visit.  Patients significant other will assist with patient care @ home and no home equipment needed.  Patient ambulated for discharge in stable condition with staff without incident.

## 2013-12-29 NOTE — Progress Notes (Signed)
Subjective: Postpartum Day 6 s/p Cesarean Delivery re-admitted for fever and suspected endomyometritis Patient reports feeling much better, no fever or chills, no pain. She denies nausea or vomiting.     Objective: Vital signs in last 24 hours: Temp:  [97.6 F (36.4 C)-98 F (36.7 C)] 97.8 F (36.6 C) (07/15 1000) Pulse Rate:  [79-89] 89 (07/15 1000) Resp:  [18] 18 (07/15 1000) BP: (108-120)/(67-77) 120/74 mmHg (07/15 1000) SpO2:  [98 %-99 %] 99 % (07/15 1000)  Physical Exam:  General: alert, cooperative and no distress Lochia: appropriate Uterine Fundus: firm Abdomen: Soft, mild distension, appropriate tenderness, no rebound no guarding.  Diffuse ecchymosis.  Incision: healing well, no significant drainage, no dehiscence, no significant erythema DVT Evaluation: No evidence of DVT seen on physical exam. Negative Homan's sign. No cords or calf tenderness.   Recent Labs  12/27/13 2241 12/28/13 0535  HGB 10.5* 11.1*  HCT 30.2* 32.2*    Assessment/Plan: Status post Cesarean section. Postoperative course complicated by endomyometritis, afebrile now for 24 hours  Will d/c patient home on po antibiotic therapy (Keflex x 10 days). She will f/u with Dr. Henderson CloudHorvath next week.  Krista Long, Krista Long STACIA 12/29/2013, 12:06 PM

## 2013-12-31 NOTE — H&P (Signed)
28 y.o. 4713w1d  [id number] comes in c/o fever to 102 at home.  She denied any other problem including, no cough, no N/V/D/C, she denied worsening incisional pain. Past Medical History  Diagnosis Date  . Hyperthyroidism     Past Surgical History  Procedure Laterality Date  . Cesarean section    . Dilation and curettage of uterus    . Cholecystectomy    . Cesarean section N/A 12/23/2013    Procedure: CESAREAN SECTION;  Surgeon: Loney LaurenceMichelle A Horvath, MD;  Location: [facility];  Service: Obstetrics;  Laterality: N/A;    OB History  Gravida Para Term Preterm AB SAB TAB Ectopic Multiple Living  4 3 3  1 1    3     # Outcome Date GA Lbr Len/2nd Weight Sex Delivery Anes PTL Lv  4 TRM 12/23/13 7513w1d  3.345 kg (7 lb 6 oz) F LVCS EPI  Y  3 SAB           2 TRM           1 TRM               History   Social History  . Marital Status: Married    Spouse Name: N/A    Number of Children: N/A  . Years of Education: N/A   Occupational History  . Not on file.   Social History Main Topics  . Smoking status: Never Smoker   . Smokeless tobacco: Never Used  . Alcohol Use: No  . Drug Use: No  . Sexual Activity: Yes    Birth Control/ Protection: None   Other Topics Concern  . Not on file   Social History Narrative  . No narrative on file   Cefdinir and Methimazole    Filed Vitals:   12/29/13 1000  BP: 120/74  Pulse: 89  Temp: 97.8 F (36.6 C)  Resp: 18     Lungs/Cor:  NAD Abdomen:  soft, mild to moderate fundal tenderness, incision appearing to be healing well, no erythema,drainage Ex:  no cords, erythema    A/P   POD #4 from c/s with possible endometritis  Pt's temp at admission to MAU 99, had taken percocet about 2 hours prior to arrival.  She did not appear to have any focal pain, and WBC was wnl, but did have some mild fundal tenderness on palpation.  Pt observed in MAU for add'l 2-3 hours and temperature again increased to 100.3, decision to admit for post-op endometritis made. Natasha BenceGent  and Shalimarlinda for IV antibiotics ordered UA sent, repeat CBC in am.  Other routine care    GirdletreeALLAHAN, [facility]

## 2014-04-18 ENCOUNTER — Encounter (HOSPITAL_COMMUNITY): Payer: Self-pay | Admitting: *Deleted

## 2015-07-26 DIAGNOSIS — E669 Obesity, unspecified: Secondary | ICD-10-CM | POA: Insufficient documentation

## 2015-07-26 HISTORY — DX: Obesity, unspecified: E66.9

## 2016-05-06 IMAGING — CT CT ABD-PELV W/ CM
1 of 3 series · 13 of 32 positions shown, 18 images · IV contrast (OMNIPAQUE)
Comparison: No priors.

CLINICAL DATA: Status post C-section earlier today. Evaluate for
blood in the abdomen or ureteral injury.

EXAM:
CT ABDOMEN AND PELVIS WITH CONTRAST
TECHNIQUE: Multidetector CT imaging of the abdomen and pelvis was performed
using the standard protocol following bolus administration of
intravenous contrast.
CONTRAST:  100mL OMNIPAQUE IOHEXOL 300 MG/ML  SOLN

[Series 2: routine abdomen/pelvis with · axial · 0.78mm/px · z∈[+547,+937]mm · 13 of 90 slices shown, 18 images]
[im 6/90  soft-tissue]
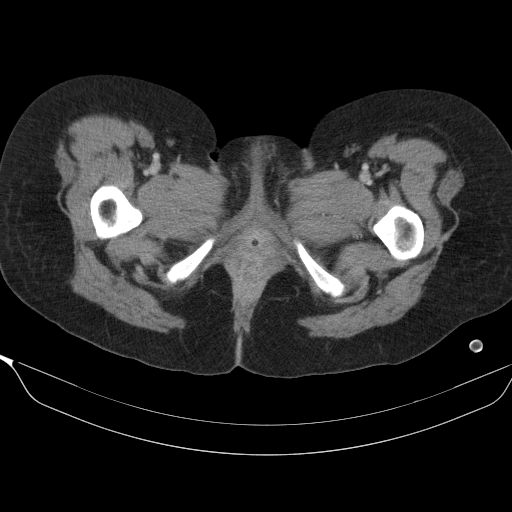
[im 6/90  bone]
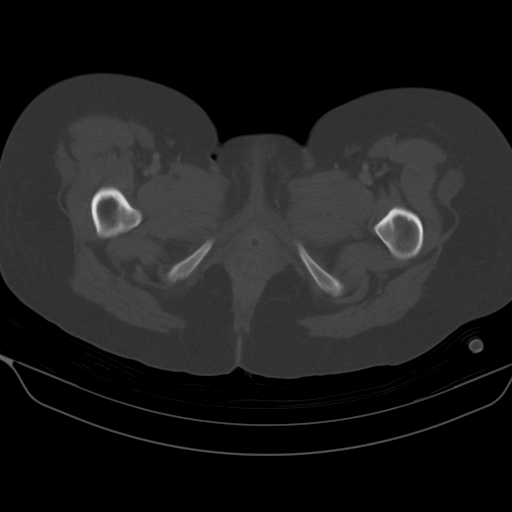
[im 12/90  soft-tissue]
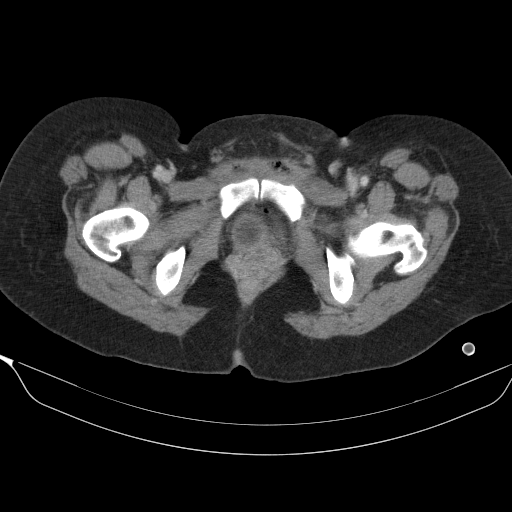
[im 18/90  soft-tissue]
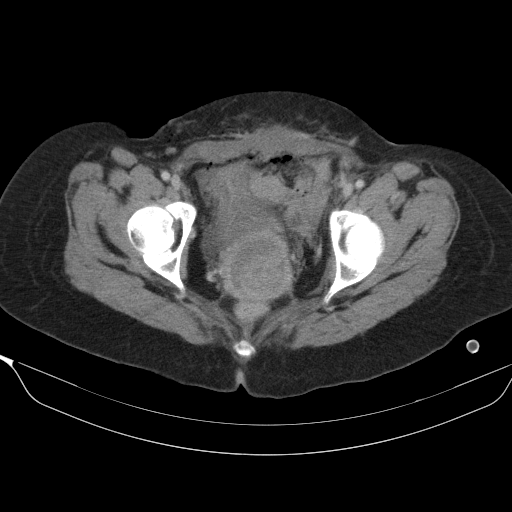
[im 30/90  soft-tissue]
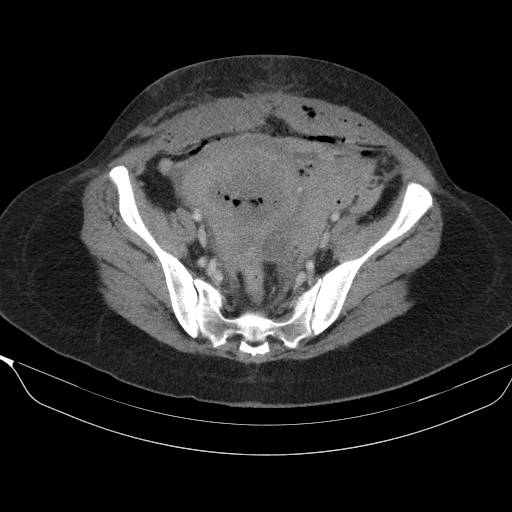
[im 36/90  soft-tissue]
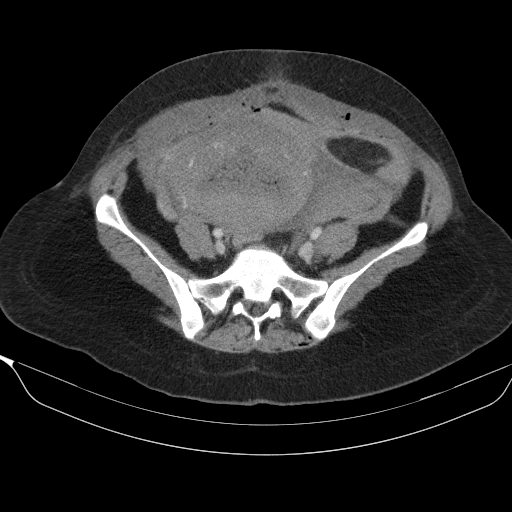
[im 42/90  soft-tissue]
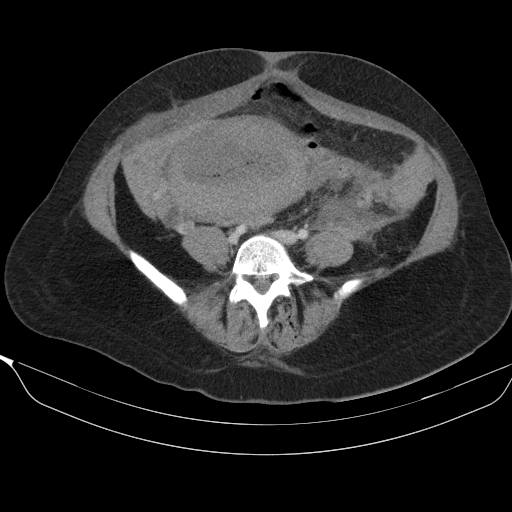
[im 48/90  soft-tissue]
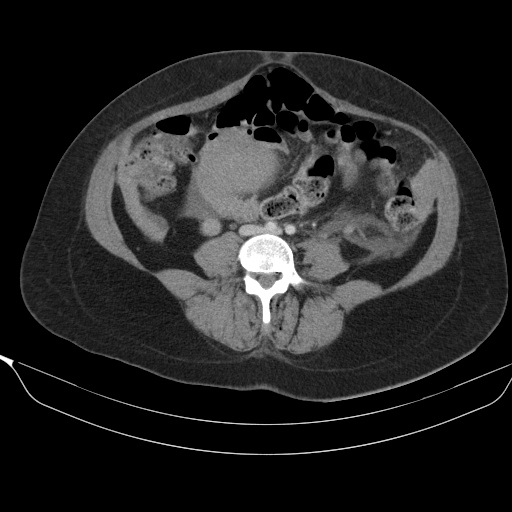
[im 54/90  soft-tissue]
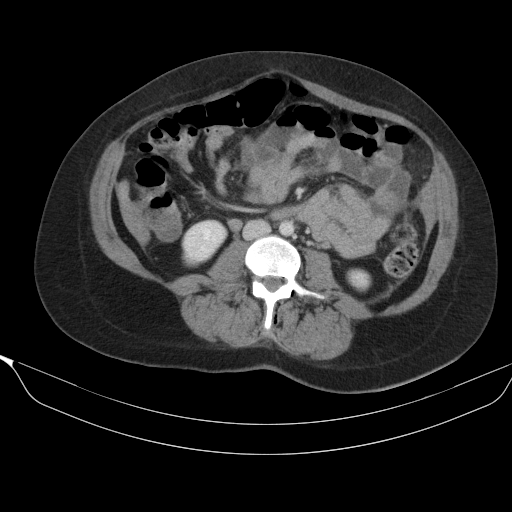
[im 60/90  soft-tissue]
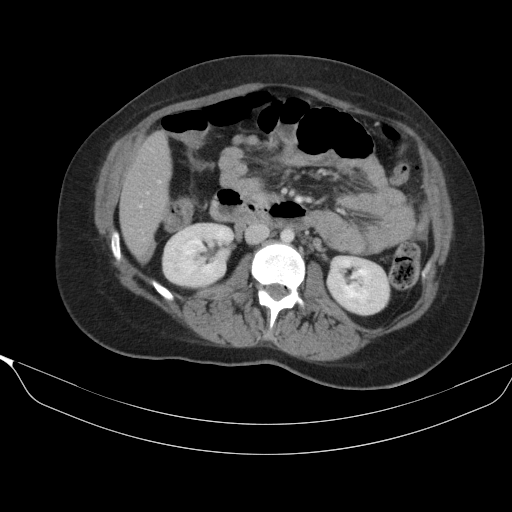
[im 60/90  bone]
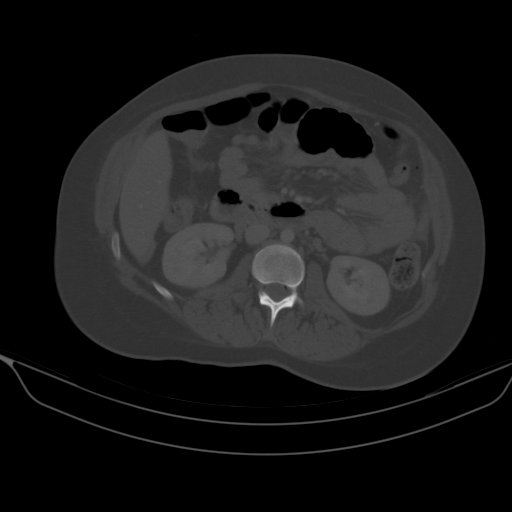
[im 66/90  lung]
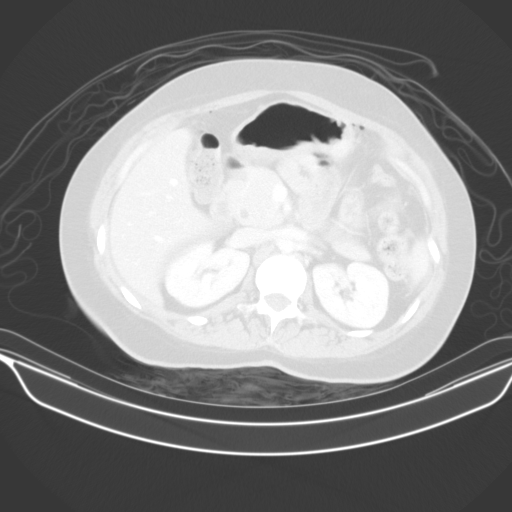
[im 72/90  soft-tissue]
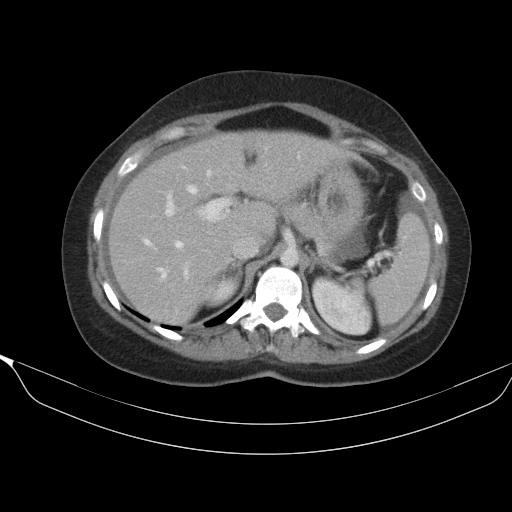
[im 72/90  lung]
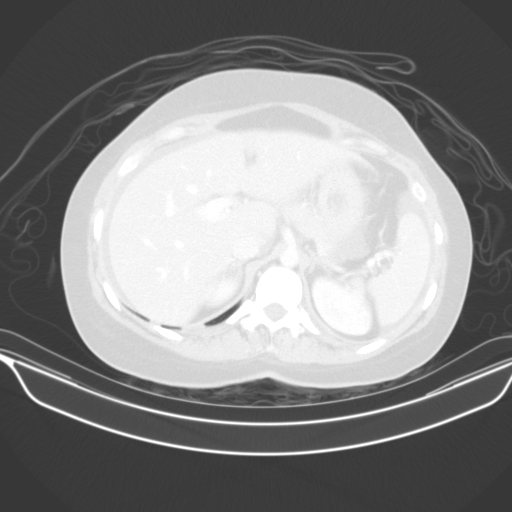
[im 78/90  soft-tissue]
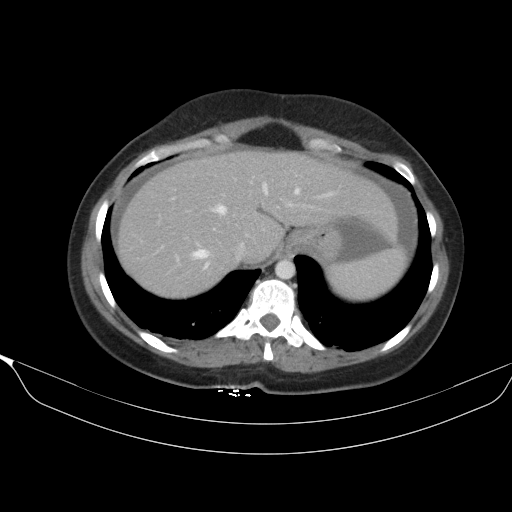
[im 78/90  lung]
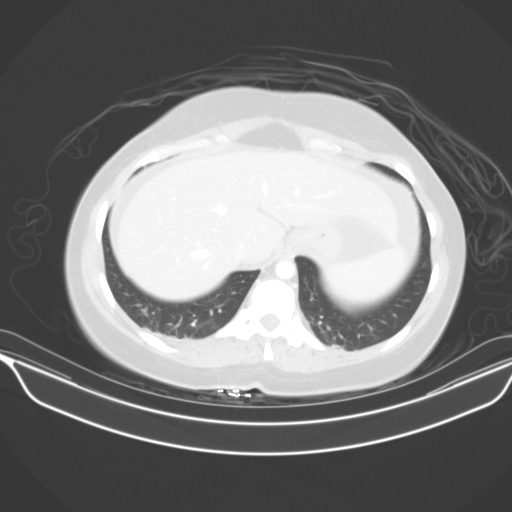
[im 84/90  soft-tissue]
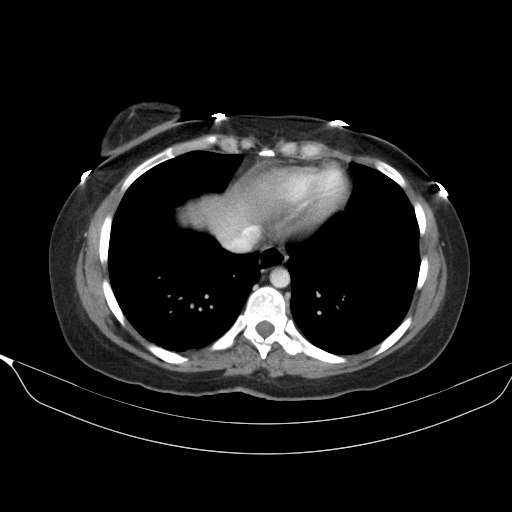
[im 84/90  lung]
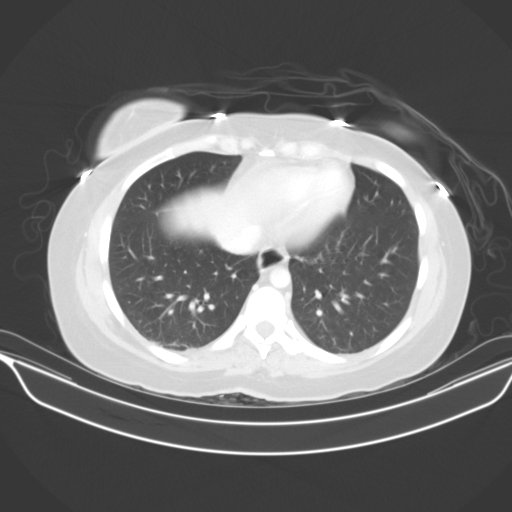

[13 of 32 positions shown; findings below may reference images not displayed]

FINDINGS: Lung Bases: Areas of dependent atelectasis are noted in the lower
lobes of the lungs bilaterally.

Abdomen/Pelvis: Status post cholecystectomy. The appearance of the
liver, pancreas, spleen, bilateral adrenal glands in the left kidney
is unremarkable. In the lower pole collecting system of the right
kidney there is a 3 mm nonobstructive calculus. Postcontrast delayed
images are limited by a lack of visualization of the distal third of
either ureter, however, the proximal [DATE] of both ureters appear
intact without definite urinary extravasation. A Foley balloon
catheter is present within the lumen of the urinary bladder. No
extravasation of contrast material from the urinary bladder is
noted.

Operative changes of recent cesarean section are noted. There is
some gas, fluid and heterogeneous attenuation material within the
endometrial canal. There is a small amount of pneumoperitoneum, gas
in the lower anterior abdominal wall and in the overlying
subcutaneous fat. There is a moderate volume of ascites which is of
mixed attenuation, some of which is low attenuation compatible with
simple fluid, while much is high attenuation, compatible with
hemoperitoneum. The largest collection of this hemorrhage is noted
in the left hemipelvis around the left adnexa, some of which appears
to be within the left broad ligament, however, there is also
considerable hemorrhagic fluid within the pericolic gutters
bilaterally. No definite site of active extravasation is identified
on today's examination.

Musculoskeletal: There are no aggressive appearing lytic or blastic
lesions noted in the visualized portions of the skeleton.
IMPRESSION: 1. There is a moderate volume of hemoperitoneum in the pericolic
gutters bilaterally, as well as a more focal collection in the
hemipelvis around the left adnexa that may be within the patient's
left broad ligament. In addition, there is moderate volume of more
simple appearing ascites.
2. Within the limitations of this examination, the ureters appear
intact. Specifically, no definite extravasation of iodinated
contrast from the ureters was noted on the delayed phase images.
3. Other expected postoperative changes related to recent cesarean
section, as above.
4. 3 mm nonobstructive calculus in the lower pole collecting system
of the right kidney.
These results were called by telephone at the time of interpretation
on 12/23/2013 at [DATE] to Dr. MARIA JULIA TAROT, who verbally
acknowledged these results.

## 2016-08-12 DIAGNOSIS — I493 Ventricular premature depolarization: Secondary | ICD-10-CM

## 2016-08-12 DIAGNOSIS — R079 Chest pain, unspecified: Secondary | ICD-10-CM

## 2016-08-12 HISTORY — DX: Chest pain, unspecified: R07.9

## 2016-08-12 HISTORY — DX: Ventricular premature depolarization: I49.3

## 2017-09-03 DIAGNOSIS — E0590 Thyrotoxicosis, unspecified without thyrotoxic crisis or storm: Secondary | ICD-10-CM | POA: Diagnosis not present

## 2017-09-03 DIAGNOSIS — Z20828 Contact with and (suspected) exposure to other viral communicable diseases: Secondary | ICD-10-CM | POA: Diagnosis not present

## 2017-09-03 DIAGNOSIS — J029 Acute pharyngitis, unspecified: Secondary | ICD-10-CM | POA: Diagnosis not present

## 2017-09-03 DIAGNOSIS — E559 Vitamin D deficiency, unspecified: Secondary | ICD-10-CM | POA: Diagnosis not present

## 2017-09-03 DIAGNOSIS — E538 Deficiency of other specified B group vitamins: Secondary | ICD-10-CM | POA: Diagnosis not present

## 2017-10-06 DIAGNOSIS — E0590 Thyrotoxicosis, unspecified without thyrotoxic crisis or storm: Secondary | ICD-10-CM | POA: Diagnosis not present

## 2017-10-14 DIAGNOSIS — E559 Vitamin D deficiency, unspecified: Secondary | ICD-10-CM

## 2017-10-14 DIAGNOSIS — E0500 Thyrotoxicosis with diffuse goiter without thyrotoxic crisis or storm: Secondary | ICD-10-CM

## 2017-10-14 DIAGNOSIS — E063 Autoimmune thyroiditis: Secondary | ICD-10-CM | POA: Insufficient documentation

## 2017-10-14 DIAGNOSIS — Z8639 Personal history of other endocrine, nutritional and metabolic disease: Secondary | ICD-10-CM

## 2017-10-14 HISTORY — DX: Autoimmune thyroiditis: E06.3

## 2017-10-14 HISTORY — DX: Personal history of other endocrine, nutritional and metabolic disease: Z86.39

## 2017-10-14 HISTORY — DX: Vitamin D deficiency, unspecified: E55.9

## 2017-10-14 HISTORY — DX: Thyrotoxicosis with diffuse goiter without thyrotoxic crisis or storm: E05.00

## 2017-10-16 DIAGNOSIS — E0500 Thyrotoxicosis with diffuse goiter without thyrotoxic crisis or storm: Secondary | ICD-10-CM | POA: Diagnosis not present

## 2017-10-16 DIAGNOSIS — M2550 Pain in unspecified joint: Secondary | ICD-10-CM | POA: Diagnosis not present

## 2017-10-16 DIAGNOSIS — M7989 Other specified soft tissue disorders: Secondary | ICD-10-CM | POA: Diagnosis not present

## 2017-11-12 ENCOUNTER — Other Ambulatory Visit: Payer: Self-pay | Admitting: Cardiology

## 2017-11-20 DIAGNOSIS — F32A Depression, unspecified: Secondary | ICD-10-CM

## 2017-11-20 DIAGNOSIS — E0590 Thyrotoxicosis, unspecified without thyrotoxic crisis or storm: Secondary | ICD-10-CM | POA: Insufficient documentation

## 2017-11-20 DIAGNOSIS — F329 Major depressive disorder, single episode, unspecified: Secondary | ICD-10-CM | POA: Insufficient documentation

## 2017-11-20 DIAGNOSIS — F419 Anxiety disorder, unspecified: Secondary | ICD-10-CM

## 2017-11-20 DIAGNOSIS — Z98891 History of uterine scar from previous surgery: Secondary | ICD-10-CM

## 2017-11-20 HISTORY — DX: History of uterine scar from previous surgery: Z98.891

## 2017-11-20 HISTORY — DX: Anxiety disorder, unspecified: F41.9

## 2017-11-20 HISTORY — DX: Depression, unspecified: F32.A

## 2017-11-20 NOTE — Progress Notes (Signed)
Cardiology Office Note:    Date:  11/21/2017   ID:  Krista Long, DOB [birth year], MRN [id number]  PCP:  Guadalupe Maple., MD  Cardiologist:  Norman Herrlich, MD    Referring MD: Levi Aland, MD    ASSESSMENT:    1. Frequent PVCs    PLAN:    In order of problems listed above:  1. Continue flecanide   Next appointment: 1 year   Medication Adjustments/Labs and Tests Ordered: Current medicines are reviewed at length with the patient today.  Concerns regarding medicines are outlined above.  No orders of the defined types were placed in this encounter.  No orders of the defined types were placed in this encounter.   Chief Complaint  Patient presents with  . Annual Exam    PVC's  . Medication Refill    Tambicor and effexor    History of Present Illness:    Krista Long is a 32 y.o. female with a hx of symptomatic PVC's  last seen 10/07/16. Compliance with diet, lifestyle and medications: yes Past Medical History:  Diagnosis Date  . Anxiety 11/20/2017  . Chest pain 08/12/2016  . Chronic autoimmune thyroiditis 10/14/2017  . Depression 11/20/2017  . Endometritis following delivery 12/28/2013  . Frequent PVCs 08/12/2016  . Graves' disease in remission 10/14/2017  . History of cesarean section 11/20/2017  . Hyperthyroidism   . Indication for care in labor or delivery 12/22/2013  . Myometritis 12/29/2013  . Obesity (BMI 30-39.9) 07/26/2015  . Personal history of hyperthyroidism 10/14/2017  . Postoperative state 12/23/2013  . Vitamin D deficiency 10/14/2017    Past Surgical History:  Procedure Laterality Date  . CESAREAN SECTION    . CESAREAN SECTION N/A 12/23/2013   Procedure: CESAREAN SECTION;  Surgeon: Loney Laurence, MD;  Location: [facility];  Service: Obstetrics;  Laterality: N/A;  . CHOLECYSTECTOMY    . DILATION AND CURETTAGE OF UTERUS      Current Medications: Current Meds  Medication Sig  . ALPRAZolam (XANAX) 0.25 MG tablet alprazolam 0.25 mg tablet  .  flecainide (TAMBOCOR) 50 MG tablet Take 1 tablet by mouth twice a day  . venlafaxine (EFFEXOR) 75 MG tablet daily.     Allergies:   Cefdinir and Methimazole   Social History   Socioeconomic History  . Marital status: Married    Spouse name: Not on file  . Number of children: Not on file  . Years of education: Not on file  . Highest education level: Not on file  Occupational History  . Not on file  Social Needs  . Financial resource strain: Not on file  . Food insecurity:    Worry: Not on file    Inability: Not on file  . Transportation needs:    Medical: Not on file    Non-medical: Not on file  Tobacco Use  . Smoking status: Never Smoker  . Smokeless tobacco: Never Used  Substance and Sexual Activity  . Alcohol use: No  . Drug use: No  . Sexual activity: Yes    Birth control/protection: None  Lifestyle  . Physical activity:    Days per week: Not on file    Minutes per session: Not on file  . Stress: Not on file  Relationships  . Social connections:    Talks on phone: Not on file    Gets together: Not on file    Attends religious service: Not on file    Active member of club or organization:  Not on file    Attends meetings of clubs or organizations: Not on file    Relationship status: Not on file  Other Topics Concern  . Not on file  Social History Narrative  . Not on file     Family History: The patient's family history is negative for Heart disease. ROS:   Please see the history of present illness.    All other systems reviewed and are negative.  EKGs/Labs/Other Studies Reviewed:    The following studies were reviewed today:  EKG:  EKG ordered today.  [facility]CH[facility]:  Normal affect    Signed, Norman HerrlichBrian Jaycob Mcclenton, MD  11/21/2017 10:19 AM    [facility]

## 2017-11-21 ENCOUNTER — Encounter: Payer: Self-pay | Admitting: Cardiology

## 2017-11-21 ENCOUNTER — Ambulatory Visit (INDEPENDENT_AMBULATORY_CARE_PROVIDER_SITE_OTHER): Payer: 59 | Admitting: Cardiology

## 2017-11-21 VITALS — BP 108/80 | HR 78 | Ht 59.75 in | Wt 170.0 lb

## 2017-11-21 DIAGNOSIS — I493 Ventricular premature depolarization: Secondary | ICD-10-CM | POA: Diagnosis not present

## 2017-11-21 MED ORDER — FLECAINIDE ACETATE 50 MG PO TABS: 50.0000 mg | ORAL_TABLET | Freq: Two times a day (BID) | ORAL | 3 refills | Status: DC

## 2017-11-21 NOTE — Patient Instructions (Addendum)
Medication Instructions:  Your physician recommends that you continue on your current medications as directed. Please refer to the Current Medication list given to you today.   Labwork: NONE  Testing/Procedures: You had an EKG today  Follow-Up: Your physician wants you to follow-up in: 1 year in [place].  You will receive a reminder letter in the mail two months in advance. If you don't receive a letter, please call our office to schedule the follow-up appointment.   Any Other Special Instructions Will Be Listed Below (If Applicable).     If you need a refill on your cardiac medications before your next appointment, please call your pharmacy.     1. Avoid all over-the-counter antihistamines except Claritin/Loratadine and Zyrtec/Cetrizine. 2. Avoid all combination including cold sinus allergies flu decongestant and sleep medications 3. You can use Robitussin DM Mucinex and Mucinex DM for cough. 4. can use Tylenol aspirin ibuprofen and naproxen but no combinations such as sleep or sinus.

## 2018-03-09 DIAGNOSIS — R002 Palpitations: Secondary | ICD-10-CM | POA: Diagnosis not present

## 2018-03-09 DIAGNOSIS — E559 Vitamin D deficiency, unspecified: Secondary | ICD-10-CM | POA: Diagnosis not present

## 2018-03-09 DIAGNOSIS — E538 Deficiency of other specified B group vitamins: Secondary | ICD-10-CM | POA: Diagnosis not present

## 2018-03-09 DIAGNOSIS — E0590 Thyrotoxicosis, unspecified without thyrotoxic crisis or storm: Secondary | ICD-10-CM | POA: Diagnosis not present

## 2018-03-09 DIAGNOSIS — Z23 Encounter for immunization: Secondary | ICD-10-CM | POA: Diagnosis not present

## 2018-03-09 DIAGNOSIS — M1990 Unspecified osteoarthritis, unspecified site: Secondary | ICD-10-CM | POA: Diagnosis not present

## 2018-03-09 DIAGNOSIS — R739 Hyperglycemia, unspecified: Secondary | ICD-10-CM | POA: Diagnosis not present

## 2018-06-02 DIAGNOSIS — Z01419 Encounter for gynecological examination (general) (routine) without abnormal findings: Secondary | ICD-10-CM | POA: Diagnosis not present

## 2018-06-02 DIAGNOSIS — Z124 Encounter for screening for malignant neoplasm of cervix: Secondary | ICD-10-CM | POA: Diagnosis not present

## 2018-07-30 DIAGNOSIS — Z79899 Other long term (current) drug therapy: Secondary | ICD-10-CM | POA: Diagnosis not present

## 2018-07-30 DIAGNOSIS — E0590 Thyrotoxicosis, unspecified without thyrotoxic crisis or storm: Secondary | ICD-10-CM | POA: Diagnosis not present

## 2018-08-03 DIAGNOSIS — Z8639 Personal history of other endocrine, nutritional and metabolic disease: Secondary | ICD-10-CM | POA: Diagnosis not present

## 2018-08-03 DIAGNOSIS — E0500 Thyrotoxicosis with diffuse goiter without thyrotoxic crisis or storm: Secondary | ICD-10-CM | POA: Diagnosis not present

## 2018-08-03 DIAGNOSIS — E063 Autoimmune thyroiditis: Secondary | ICD-10-CM | POA: Diagnosis not present

## 2018-10-12 ENCOUNTER — Telehealth: Payer: Self-pay | Admitting: Cardiology

## 2018-10-12 NOTE — Telephone Encounter (Signed)
Virtual Visit Pre-Appointment Phone Call  "(Name), I am calling you today to discuss your upcoming appointment. We are currently trying to limit exposure to the virus that causes COVID-19 by seeing patients at home rather than in the office."  1. "What is the BEST phone number to call the day of the visit?" - include this in appointment notes  2. Do you have or have access to (through a family member/friend) a smartphone with video capability that we can use for your visit?" a. If yes - list this number in appt notes as cell (if different from BEST phone #) and list the appointment type as a VIDEO visit in appointment notes b. If no - list the appointment type as a PHONE visit in appointment notes  3. Confirm consent - "In the setting of the current Covid19 crisis, you are scheduled for a (phone or video) visit with your provider on (date) at (time).  Just as we do with many in-office visits, in order for you to participate in this visit, we must obtain consent.  If you'd like, I can send this to your mychart (if signed up) or email for you to review.  Otherwise, I can obtain your verbal consent now.  All virtual visits are billed to your insurance company just like a normal visit would be.  By agreeing to a virtual visit, we'd like you to understand that the technology does not allow for your provider to perform an examination, and thus may limit your provider's ability to fully assess your condition. If your provider identifies any concerns that need to be evaluated in person, we will make arrangements to do so.  Finally, though the technology is pretty good, we cannot assure that it will always work on either your or our end, and in the setting of a video visit, we may have to convert it to a phone-only visit.  In either situation, we cannot ensure that we have a secure connection.  Are you willing to proceed?" STAFF: Did the patient verbally acknowledge consent to telehealth visit? Document  YES/NO here: Yes  4. Advise patient to be prepared - "Two hours prior to your appointment, go ahead and check your blood pressure, pulse, oxygen saturation, and your weight (if you have the equipment to check those) and write them all down. When your visit starts, your provider will ask you for this information. If you have an Apple Watch or Kardia device, please plan to have heart rate information ready on the day of your appointment. Please have a pen and paper handy nearby the day of the visit as well."  5. Give patient instructions for MyChart download to smartphone OR [email or web address] as below if video visit (depending on what platform provider is using)  6. Inform patient they will receive a phone call 15 minutes prior to their appointment time (may be from unknown caller ID) so they should be prepared to answer    TELEPHONE CALL NOTE  Krista Long has been deemed a candidate for a follow-up tele-health visit to limit community exposure during the Covid-19 pandemic. I spoke with the patient via phone to ensure availability of phone/video source, confirm preferred email & phone number, and discuss instructions and expectations.  I reminded Krista Long to be prepared with any vital sign and/or heart rhythm information that could potentially be obtained via home monitoring, at the time of her visit. I reminded Krista Long to expect a phone call prior to  her visit.  [name] 10/12/2018 3:37 PM      FULL LENGTH CONSENT FOR TELE-HEALTH VISIT   I hereby voluntarily request, consent and authorize [facility] and its employed or contracted physicians, physician assistants, nurse practitioners or other licensed health care professionals (the Practitioner), to provide me with telemedicine health care services (the Services") as deemed necessary by the treating Practitioner. I acknowledge and consent to receive the Services by the Practitioner via telemedicine. I understand  that the telemedicine visit will involve communicating with the Practitioner through live audiovisual communication technology and the disclosure of certain medical information by electronic transmission. I acknowledge that I have been given the opportunity to request an in-person assessment or other available alternative prior to the telemedicine visit and am voluntarily participating in the telemedicine visit.  I understand that I have the right to withhold or withdraw my consent to the use of telemedicine in the course of my care at any time, without affecting my right to future care or treatment, and that the Practitioner or I may terminate the telemedicine visit at any time. I understand that I have the right to inspect all information obtained and/or recorded in the course of the telemedicine visit and may receive copies of available information for a reasonable fee.  I understand that some of the potential risks of receiving the Services via telemedicine include:   Delay or interruption in medical evaluation due to technological equipment failure or disruption;  Information transmitted may not be sufficient (e.g. poor resolution of images) to allow for appropriate medical decision making by the Practitioner; and/or   In rare instances, security protocols could fail, causing a breach of personal health information.  Furthermore, I acknowledge that it is my responsibility to provide information about my medical history, conditions and care that is complete and accurate to the best of my ability. I acknowledge that Practitioner's advice, recommendations, and/or decision may be based on factors not within their control, such as incomplete or inaccurate data provided by me or distortions of diagnostic images or specimens that may result from electronic transmissions. I understand that the practice of medicine is not an exact science and that Practitioner makes no warranties or guarantees regarding  treatment outcomes. I acknowledge that I will receive a copy of this consent concurrently upon execution via email to the email address I last provided but may also request a printed copy by calling the office of [facility].    I understand that my insurance will be billed for this visit.   I have read or had this consent read to me.  I understand the contents of this consent, which adequately explains the benefits and risks of the Services being provided via telemedicine.   I have been provided ample opportunity to ask questions regarding this consent and the Services and have had my questions answered to my satisfaction.  I give my informed consent for the services to be provided through the use of telemedicine in my medical care  By participating in this telemedicine visit I agree to the above.

## 2018-10-13 NOTE — Progress Notes (Signed)
Virtual Visit via Video Note   This visit type was conducted due to national recommendations for restrictions regarding the COVID-19 Pandemic (e.g. social distancing) in an effort to limit this patient's exposure and mitigate transmission in our community.  Due to her co-morbid illnesses, this patient is at least at moderate risk for complications without adequate follow up.  This format is felt to be most appropriate for this patient at this time.  All issues noted in this document were discussed and addressed.  A limited physical exam was performed with this format.  Please refer to the patient's chart for her consent to telehealth for [facility].   Evaluation Performed:  Follow-up visit  Date:  10/14/2018   ID:  IDESSA NIRO, DOB 1985/12/07, MRN [id number]  Patient Location: Home Provider Location: Home  PCP:  Guadalupe Maple., MD  Cardiologist:  No primary care provider on file. Dr Dulce Sellar Electrophysiologist:  None   Chief Complaint: Frequent PVCs  History of Present Illness:    BARBARELLA WIKER is a 33 y.o. female with symptomatic frequent PVC's suppressed with flecanide last seen 11/21/17.  The patient does not have symptoms concerning for COVID-19 infection (fever, chills, cough, or new shortness of breath).   With COVID-19 high-level stress is increased and several times a week she notices extra beats not severe not sustained she tolerates her flecainide has no side effects.  No chest pain edema shortness of breath or syncope.  Labs performed in her PCP office 07/30/2018 shows a normal CMP potassium 4.1 GFR 116 cc hemoglobin was 13.2 and her TSH T4 and free T4 were all normal.  Past Medical History:  Diagnosis Date  . Anxiety 11/20/2017  . Chest pain 08/12/2016  . Chronic autoimmune thyroiditis 10/14/2017  . Depression 11/20/2017  . Endometritis following delivery 12/28/2013  . Frequent PVCs 08/12/2016  . Graves' disease in remission 10/14/2017  . History of cesarean section  11/20/2017  . Hyperthyroidism   . Indication for care in labor or delivery 12/22/2013  . Myometritis 12/29/2013  . Obesity (BMI 30-39.9) 07/26/2015  . Personal history of hyperthyroidism 10/14/2017  . Postoperative state 12/23/2013  . Vitamin D deficiency 10/14/2017   Past Surgical History:  Procedure Laterality Date  . CESAREAN SECTION    . CESAREAN SECTION N/A 12/23/2013   Procedure: CESAREAN SECTION;  Surgeon: Loney Laurence, MD;  Location: [facility];  Service: Obstetrics;  Laterality: N/A;  . CHOLECYSTECTOMY    . DILATION AND CURETTAGE OF UTERUS       Current Meds  Medication Sig  . ALPRAZolam (XANAX) 0.25 MG tablet Take 0.25 mg by mouth daily as needed.   . flecainide (TAMBOCOR) 50 MG tablet Take 1 tablet (50 mg total) by mouth 2 (two) times daily.  Marland Kitchen venlafaxine XR (EFFEXOR-XR) 37.5 MG 24 hr capsule Take 1 capsule by mouth daily.     Allergies:   Cefdinir and Methimazole   Social History   Tobacco Use  . Smoking status: Never Smoker  . Smokeless tobacco: Never Used  Substance Use Topics  . Alcohol use: No  . Drug use: No     Family Hx: The patient's family history is negative for Heart disease and Diabetes.  ROS:   Please see the history of present illness.     All other systems reviewed and are negative.   Prior CV studies:   The following studies were reviewed today:    Labs/Other Tests and Data Reviewed:    EKG:  No ECG reviewed.   Wt Readings from Last 3 Encounters:  10/14/18 176 lb (79.8 kg)  11/21/17 170 lb (77.1 kg)  12/27/13 155 lb 9.6 oz (70.6 kg)     Objective:    Vital Signs:  BP 110/76 (BP Location: Left Arm, Patient Position: Sitting)   Pulse 90   Ht 4' 11.75" (1.518 m)   Wt 176 lb (79.8 kg)   BMI 34.66 kg/m    Constitutional, well-nourished well-developed in no acute distress Vital signs reviewed Eyes, conjunctiva and sclera are normal without pallor or icterus extraocular motions intact and normal there is no lid lag Respiratory,  normal effort and excursion no audible wheezing without a stethoscope Cardiovascular, no neck vein distention or peripheral edema Skin, no rash skin lesion or ulceration of the extremities Neurologic, cranial nerves II to XII are grossly intact and the patient moves all 4 extremities Neuro/Psychiatric, judgment and thought processes are intact and coherent, alert and oriented x3, mood and affect appear normal.  ASSESSMENT & PLAN:    1. Frequent PVCs stable continue flecainide and then have her come to my office in follow-up in September to do an EKG and if symptoms worsen to contact me.  She would benefit then from an ambulatory monitor and if PVC burden exceeds 10% PVC ablation is an attractive alternative.  At this time neither of us feel is necessary she takes a minimum dose of flecainide no evidence of toxicity continue the same and avoid over-the-counter proarrhythmic drugs  COVID-19 Education: The signs and symptoms of COVID-19 were discussed with the patient and how to seek care for testing (follow up with PCP or arrange E-visit).  The importance of social distancing was discussed today.  Time:   Today, I have spent 21 minutes with the patient with telehealth technology discussing the above problems.     Medication Adjustments/Labs and Tests Ordered: Current medicines are reviewed at length with the patient today.  Concerns regarding medicines are outlined above.   Tests Ordered: No orders of the defined types were placed in this encounter.   Medication Changes: No orders of the defined types were placed in this encounter.   Disposition:  Follow up in 4 month(s)  Signed, Norman HerrlichBrian Rayvon Brandvold, MD  10/14/2018 2:44 PM    [facility]

## 2018-10-14 ENCOUNTER — Telehealth (INDEPENDENT_AMBULATORY_CARE_PROVIDER_SITE_OTHER): Payer: 59 | Admitting: Cardiology

## 2018-10-14 ENCOUNTER — Other Ambulatory Visit: Payer: Self-pay

## 2018-10-14 ENCOUNTER — Encounter: Payer: Self-pay | Admitting: Cardiology

## 2018-10-14 VITALS — BP 110/76 | HR 90 | Ht 59.75 in | Wt 176.0 lb

## 2018-10-14 DIAGNOSIS — Z79899 Other long term (current) drug therapy: Secondary | ICD-10-CM

## 2018-10-14 DIAGNOSIS — I493 Ventricular premature depolarization: Secondary | ICD-10-CM

## 2018-10-14 MED ORDER — FLECAINIDE ACETATE 50 MG PO TABS: 50.0000 mg | ORAL_TABLET | Freq: Two times a day (BID) | ORAL | 1 refills | Status: DC

## 2018-10-14 NOTE — Patient Instructions (Addendum)
Medication Instructions:  Your physician recommends that you continue on your current medications as directed. Please refer to the Current Medication list given to you today.  If you need a refill on your cardiac medications before your next appointment, please call your pharmacy.   Lab work: None  If you have labs (blood work) drawn today and your tests are completely normal, you will receive your results only by: Marland Kitchen MyChart Message (if you have MyChart) OR . A paper copy in the mail If you have any lab test that is abnormal or we need to change your treatment, we will call you to review the results.  Testing/Procedures: None  Follow-Up: At [facility], you and your health needs are our priority.  As part of our continuing mission to provide you with exceptional heart care, we have created designated Provider Care Teams.  These Care Teams include your primary Cardiologist (physician) and Advanced Practice Providers (APPs -  Physician Assistants and Nurse Practitioners) who all work together to provide you with the care you need, when you need it. You will need a follow up appointment in 5 months: Wednesday, 02/17/2019, at 3:40 pm in the [place] office. Please arrive at 3:25 pm.

## 2018-10-14 NOTE — Addendum Note (Signed)
Addended by: Crist Fat on: 10/14/2018 03:22 PM   Modules accepted: Orders

## 2019-02-17 ENCOUNTER — Ambulatory Visit: Payer: 59 | Admitting: Cardiology

## 2019-04-14 NOTE — Progress Notes (Signed)
Cardiology Office Note:    Date:  04/15/2019   ID:  [name], DOB 1986/02/13, MRN [id number]  PCP:  Guadalupe Maple., MD  Cardiologist:  Norman Herrlich, MD    Referring MD: Guadalupe Maple., MD    ASSESSMENT:    1. Frequent PVCs   2. Encounter for monitoring flecainide therapy    PLAN:    In order of problems listed above:  1. Improved symptomatically good response 2. Continue low-dose flecainide without evidence of toxicity.  She will avoid over-the-counter proarrhythmic drugs   Next appointment: 1 year   Medication Adjustments/Labs and Tests Ordered: Current medicines are reviewed at length with the patient today.  Concerns regarding medicines are outlined above.  Orders Placed This Encounter  Procedures  . EKG 12-Lead   Meds ordered this encounter  Medications  . flecainide (TAMBOCOR) 50 MG tablet    Sig: Take 1 tablet (50 mg total) by mouth 2 (two) times daily.    Dispense:  180 tablet    Refill:  3    Chief Complaint  Patient presents with  . Follow-up    History of Present Illness:    Krista Long is a 33 y.o. female with a hx of symptomatic frequent PVC's suppressed with flecanide   last seen 10/14/2018. 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  She is pleased with the response to flecainide has had no side effects we will continue the same.  She had recent lab work in her PCP office in August with normal renal function and TSH.  I reviewed these labs from [facility] with the patient during the visit creatinine 0.64 TSH 1.12 Past Medical History:  Diagnosis Date  . Anxiety 11/20/2017  . Chest pain 08/12/2016  . Chronic autoimmune thyroiditis 10/14/2017  . Depression 11/20/2017  . Endometritis following delivery 12/28/2013  . Frequent PVCs 08/12/2016  . Graves' disease in remission 10/14/2017  . History of cesarean section  11/20/2017  . Hyperthyroidism   . Indication for care in labor or delivery 12/22/2013  . Myometritis 12/29/2013  . Obesity (BMI 30-39.9) 07/26/2015  . Personal history of hyperthyroidism 10/14/2017  . Postoperative state 12/23/2013  . Vitamin D deficiency 10/14/2017    Past Surgical History:  Procedure Laterality Date  . CESAREAN SECTION    . CESAREAN SECTION N/A 12/23/2013   Procedure: CESAREAN SECTION;  Surgeon: Loney Laurence, MD;  Location: [facility];  Service: Obstetrics;  Laterality: N/A;  . CHOLECYSTECTOMY    . DILATION AND CURETTAGE OF UTERUS      Current Medications: Current Meds  Medication Sig  . ALPRAZolam (XANAX) 0.25 MG tablet Take 0.25 mg by mouth daily as needed.   . flecainide (TAMBOCOR) 50 MG tablet Take 1 tablet (50 mg total) by mouth 2 (two) times daily.  Marland Kitchen venlafaxine XR (EFFEXOR-XR) 37.5 MG 24 hr capsule Take 1 capsule by mouth daily.  . [DISCONTINUED] flecainide (TAMBOCOR) 50 MG tablet Take 1 tablet (50 mg total) by mouth 2 (two) times daily.     Allergies:   Cefdinir and Methimazole   Social History   Socioeconomic History  . Marital status: Married    Spouse name: Not on file  . Number of children: Not on file  . Years of education: Not on file  . Highest education level: Not on file  Occupational History  . Not on  file  Social Needs  . Financial resource strain: Not on file  . Food insecurity    Worry: Not on file    Inability: Not on file  . Transportation needs    Medical: Not on file    Non-medical: Not on file  Tobacco Use  . Smoking status: Never Smoker  . Smokeless tobacco: Never Used  Substance and Sexual Activity  . Alcohol use: No  . Drug use: No  . Sexual activity: Yes    Birth control/protection: None  Lifestyle  . Physical activity    Days per week: Not on file    Minutes per session: Not on file  . Stress: Not on file  Relationships  . Social Herbalist on phone: Not on file    Gets together: Not on file    Attends  religious service: Not on file    Active member of club or organization: Not on file    Attends meetings of clubs or organizations: Not on file    Relationship status: Not on file  Other Topics Concern  . Not on file  Social History Narrative  . Not on file     Family History: The patient's family history is negative for Heart disease and Diabetes. ROS:   Please see the history of present illness.    All other systems reviewed and are negative.  EKGs/Labs/Other Studies Reviewed:    The following studies were reviewed today:  EKG:  EKG ordered today and personally reviewed.  The ekg ordered today demonstrates sinus rhythm occasional PVCs normal QRS duration QT interval    Physical Exam:    VS:  BP 116/68 (BP Location: Right Arm)   Pulse 89   Ht 4' 11.75" (1.518 m)   Wt 177 lb 6.4 oz (80.5 kg)   SpO2 97%   BMI 34.94 kg/m     Wt Readings from Last 3 Encounters:  04/15/19 177 lb 6.4 oz (80.5 kg)  10/14/18 176 lb (79.8 kg)  11/21/17 170 lb (77.1 kg)     GEN:  Well nourished, well developed in no acute distress HEENT: Normal NECK: No JVD; No carotid bruits LYMPHATICS: No lymphadenopathy CARDIAC: RRR, no murmurs, rubs, gallops RESPIRATORY:  Clear to auscultation without rales, wheezing or rhonchi  ABDOMEN: Soft, non-tender, non-distended MUSCULOSKELETAL:  No edema; No deformity  SKIN: Warm and dry NEUROLOGIC:  Alert and oriented x 3 PSYCHIATRIC:  Normal affect    Signed, Shirlee More, MD  04/15/2019 4:52 PM    [facility]

## 2019-04-15 ENCOUNTER — Ambulatory Visit (INDEPENDENT_AMBULATORY_CARE_PROVIDER_SITE_OTHER): Payer: 59 | Admitting: Cardiology

## 2019-04-15 ENCOUNTER — Other Ambulatory Visit: Payer: Self-pay

## 2019-04-15 ENCOUNTER — Encounter: Payer: Self-pay | Admitting: Cardiology

## 2019-04-15 VITALS — BP 116/68 | HR 89 | Ht 59.75 in | Wt 177.4 lb

## 2019-04-15 DIAGNOSIS — Z79899 Other long term (current) drug therapy: Secondary | ICD-10-CM | POA: Diagnosis not present

## 2019-04-15 DIAGNOSIS — I493 Ventricular premature depolarization: Secondary | ICD-10-CM | POA: Diagnosis not present

## 2019-04-15 DIAGNOSIS — Z5181 Encounter for therapeutic drug level monitoring: Secondary | ICD-10-CM

## 2019-04-15 MED ORDER — FLECAINIDE ACETATE 50 MG PO TABS: 50.0000 mg | ORAL_TABLET | Freq: Two times a day (BID) | ORAL | 3 refills | Status: DC

## 2019-04-15 NOTE — Patient Instructions (Signed)
Medication Instructions:  Your physician recommends that you continue on your current medications as directed. Please refer to the Current Medication list given to you today.  *If you need a refill on your cardiac medications before your next appointment, please call your pharmacy*  Lab Work: None  If you have labs (blood work) drawn today and your tests are completely normal, you will receive your results only by: . MyChart Message (if you have MyChart) OR . A paper copy in the mail If you have any lab test that is abnormal or we need to change your treatment, we will call you to review the results.  Testing/Procedures: You had an EKG today.   Follow-Up: At [facility], you and your health needs are our priority.  As part of our continuing mission to provide you with exceptional heart care, we have created designated Provider Care Teams.  These Care Teams include your primary Cardiologist (physician) and Advanced Practice Providers (APPs -  Physician Assistants and Nurse Practitioners) who all work together to provide you with the care you need, when you need it.  Your next appointment:   12 months  The format for your next appointment:   In Person  Provider:   Brian Munley, MD   

## 2019-08-04 DIAGNOSIS — E063 Autoimmune thyroiditis: Secondary | ICD-10-CM | POA: Diagnosis not present

## 2019-08-13 DIAGNOSIS — E559 Vitamin D deficiency, unspecified: Secondary | ICD-10-CM | POA: Diagnosis not present

## 2019-08-13 DIAGNOSIS — F410 Panic disorder [episodic paroxysmal anxiety] without agoraphobia: Secondary | ICD-10-CM | POA: Diagnosis not present

## 2019-08-13 DIAGNOSIS — E063 Autoimmune thyroiditis: Secondary | ICD-10-CM | POA: Diagnosis not present

## 2019-08-13 DIAGNOSIS — E0500 Thyrotoxicosis with diffuse goiter without thyrotoxic crisis or storm: Secondary | ICD-10-CM | POA: Diagnosis not present

## 2019-08-13 DIAGNOSIS — Z8639 Personal history of other endocrine, nutritional and metabolic disease: Secondary | ICD-10-CM | POA: Diagnosis not present

## 2019-08-13 DIAGNOSIS — R002 Palpitations: Secondary | ICD-10-CM | POA: Diagnosis not present

## 2019-09-10 DIAGNOSIS — Z79899 Other long term (current) drug therapy: Secondary | ICD-10-CM | POA: Diagnosis not present

## 2019-09-10 DIAGNOSIS — Z6836 Body mass index (BMI) 36.0-36.9, adult: Secondary | ICD-10-CM | POA: Diagnosis not present

## 2019-09-10 DIAGNOSIS — F410 Panic disorder [episodic paroxysmal anxiety] without agoraphobia: Secondary | ICD-10-CM | POA: Diagnosis not present

## 2019-09-15 DIAGNOSIS — Z6835 Body mass index (BMI) 35.0-35.9, adult: Secondary | ICD-10-CM | POA: Diagnosis not present

## 2019-09-15 DIAGNOSIS — Z01419 Encounter for gynecological examination (general) (routine) without abnormal findings: Secondary | ICD-10-CM | POA: Diagnosis not present

## 2020-02-09 DIAGNOSIS — E063 Autoimmune thyroiditis: Secondary | ICD-10-CM | POA: Diagnosis not present

## 2020-02-14 DIAGNOSIS — E063 Autoimmune thyroiditis: Secondary | ICD-10-CM | POA: Diagnosis not present

## 2020-02-14 DIAGNOSIS — Z8639 Personal history of other endocrine, nutritional and metabolic disease: Secondary | ICD-10-CM | POA: Diagnosis not present

## 2020-02-14 DIAGNOSIS — E559 Vitamin D deficiency, unspecified: Secondary | ICD-10-CM | POA: Diagnosis not present

## 2020-02-25 DIAGNOSIS — R5383 Other fatigue: Secondary | ICD-10-CM | POA: Diagnosis not present

## 2020-02-25 DIAGNOSIS — F410 Panic disorder [episodic paroxysmal anxiety] without agoraphobia: Secondary | ICD-10-CM | POA: Diagnosis not present

## 2020-02-25 DIAGNOSIS — E559 Vitamin D deficiency, unspecified: Secondary | ICD-10-CM | POA: Diagnosis not present

## 2020-02-25 DIAGNOSIS — E538 Deficiency of other specified B group vitamins: Secondary | ICD-10-CM | POA: Diagnosis not present

## 2020-02-25 DIAGNOSIS — Z6832 Body mass index (BMI) 32.0-32.9, adult: Secondary | ICD-10-CM | POA: Diagnosis not present

## 2020-02-25 DIAGNOSIS — Z79899 Other long term (current) drug therapy: Secondary | ICD-10-CM | POA: Diagnosis not present

## 2020-03-07 DIAGNOSIS — E538 Deficiency of other specified B group vitamins: Secondary | ICD-10-CM | POA: Diagnosis not present

## 2020-03-09 DIAGNOSIS — R61 Generalized hyperhidrosis: Secondary | ICD-10-CM | POA: Diagnosis not present

## 2020-03-23 DIAGNOSIS — R61 Generalized hyperhidrosis: Secondary | ICD-10-CM | POA: Diagnosis not present

## 2020-04-02 NOTE — Progress Notes (Signed)
Cardiology Office Note:    Date:  04/03/2020   ID:  Krista Long, DOB 1985/11/12, MRN [id number]  PCP:  Guadalupe Maple., MD  Cardiologist:  Norman Herrlich, MD    Referring MD: No ref. provider found    ASSESSMENT:    1. Frequent PVCs   2. High risk medication use   3. Graves' disease in remission    PLAN:    In order of problems listed above:  1. Continue frequent PVCs presently off flecainide recheck 3-day ZIO for burden and echocardiogram for PVC induced cardiomyopathy 2. Stable thyroid disease   Next appointment: 6 weeks   Medication Adjustments/Labs and Tests Ordered: Current medicines are reviewed at length with the patient today.  Concerns regarding medicines are outlined above.  Orders Placed This Encounter  Procedures  . EKG 12-Lead   No orders of the defined types were placed in this encounter.   Chief complaint, frequent PVCs  History of Present Illness:    Krista Long is a 34 y.o. female with a hx of symptomatic frequent PVC's suppressed with flecanide last seen 04/15/2019.  She is seen by endocrinology for thyroid disease her last T4 02/09/2020 normal 1.07 Free T3 normal 2.8 TSH normal 1.38.  She is no longer on suppressive therapy Compliance with diet, lifestyle and medications: Yes  She is off flecainide is unchanged at the same level of palpitation fatigue and has some vague nonexertional chest pain that lasts for hours at a time.  She just finds it a nuisance.  No syncope the symptoms have not worsened and she is not improved or change stopping flecainide.  She is at risk for PVC induced cardiomyopathy we will check a 3-day ZIO monitor for burden off suppressant treatment and recheck an echocardiogram.  An option for therapy would include EP ablation.  She thinks that much of this is related to her thyroid and she has never been the same since she was hyperthyroid.  Holter monitor:: 09/04/16  Indication: Frequent PVCs  Duration: 48 hours  Heart  rhythm: Sinus, sinus tachycardia  Hear rate: average 64 Maximum139 bpm sinus tachycardia   Rhythm Findings:  1.Ventricular: High frequency of PVCs  28.7% with 2 couplets and no episodes of ventricular tachycardia.  2.Supraventricular: No PACs  3.Bradyarrhythmias and Pauses: None  Symptoms reported:None   CONCLUSIONS:  1.Abnormal Holter monitor.  2.Arrhythmia as described with a high PVC burden  3.Symptoms were not reported  4.Symptoms were not correlated with arrhythmias   Echocardiogram 09/04/17: Normal EF 55-60%   Past Medical History:  Diagnosis Date  . Anxiety 11/20/2017  . Chest pain 08/12/2016  . Chronic autoimmune thyroiditis 10/14/2017  . Depression 11/20/2017  . Endometritis following delivery 12/28/2013  . Frequent PVCs 08/12/2016  . Graves' disease in remission 10/14/2017  . History of cesarean section 11/20/2017  . Hyperthyroidism   . Indication for care in labor or delivery 12/22/2013  . Myometritis 12/29/2013  . Obesity (BMI 30-39.9) 07/26/2015  . Personal history of hyperthyroidism 10/14/2017  . Postoperative state 12/23/2013  . Vitamin D deficiency 10/14/2017    Past Surgical History:  Procedure Laterality Date  . CESAREAN SECTION    . CESAREAN SECTION N/A 12/23/2013   Procedure: CESAREAN SECTION;  Surgeon: Loney Laurence, MD;  Location: [facility];  Service: Obstetrics;  Laterality: N/A;  . CHOLECYSTECTOMY    . DILATION AND CURETTAGE OF UTERUS      Current Medications: Current Meds  Medication Sig  . ALPRAZolam Prudy Feeler) 1  MG tablet Take 1 mg by mouth daily as needed.  . APRI 0.15-30 MG-MCG tablet Take 1 tablet by mouth daily.  Marland Kitchen venlafaxine XR (EFFEXOR-XR) 150 MG 24 hr capsule Take 1 capsule by mouth daily.     Allergies:   Cefdinir and Methimazole   Social History   Socioeconomic History  . Marital status: Married    Spouse name: Not on file  . Number of children: Not on file  . Years of education: Not on file  . Highest  education level: Not on file  Occupational History  . Not on file  Tobacco Use  . Smoking status: Never Smoker  . Smokeless tobacco: Never Used  Vaping Use  . Vaping Use: Never used  Substance and Sexual Activity  . Alcohol use: No  . Drug use: No  . Sexual activity: Yes    Birth control/protection: None  Other Topics Concern  . Not on file  Social History Narrative  . Not on file   Social Determinants of Health   Financial Resource Strain:   . Difficulty of Paying Living Expenses: Not on file  Food Insecurity:   . Worried About Programme researcher, broadcasting/film/video in the Last Year: Not on file  . Ran Out of Food in the Last Year: Not on file  Transportation Needs:   . Lack of Transportation (Medical): Not on file  . Lack of Transportation (Non-Medical): Not on file  Physical Activity:   . Days of Exercise per Week: Not on file  . Minutes of Exercise per Session: Not on file  Stress:   . Feeling of Stress : Not on file  Social Connections:   . Frequency of Communication with Friends and Family: Not on file  . Frequency of Social Gatherings with Friends and Family: Not on file  . Attends Religious Services: Not on file  . Active Member of Clubs or Organizations: Not on file  . Attends Banker Meetings: Not on file  . Marital Status: Not on file     Family History: The patient's family history is negative for Heart disease and Diabetes. ROS:   Please see the history of present illness.    All other systems reviewed and are negative.  EKGs/Labs/Other Studies Reviewed:    The following studies were reviewed today:  EKG:  EKG ordered today and personally reviewed.  The ekg ordered today demonstrates sinus rhythm normal frequent PVCs bigeminy   Physical Exam:    VS:  BP 118/75   Pulse 89   Ht 4\' 11"  (1.499 m)   Wt 163 lb 6.4 oz (74.1 kg)   SpO2 96%   BMI 33.00 kg/m     Wt Readings from Last 3 Encounters:  04/03/20 163 lb 6.4 oz (74.1 kg)  04/15/19 177 lb 6.4  oz (80.5 kg)  10/14/18 176 lb (79.8 kg)     GEN:  Well nourished, well developed in no acute distress HEENT: Normal NECK: No JVD; No carotid bruits LYMPHATICS: No lymphadenopathy CARDIAC: RRR, no murmurs, rubs, gallops RESPIRATORY:  Clear to auscultation without rales, wheezing or rhonchi  ABDOMEN: Soft, non-tender, non-distended MUSCULOSKELETAL:  No edema; No deformity  SKIN: Warm and dry NEUROLOGIC:  Alert and oriented x 3 PSYCHIATRIC:  Normal affect    Signed, 10/16/18, MD  04/03/2020 12:04 PM    [facility]

## 2020-04-03 ENCOUNTER — Ambulatory Visit (INDEPENDENT_AMBULATORY_CARE_PROVIDER_SITE_OTHER): Payer: BC Managed Care – PPO

## 2020-04-03 ENCOUNTER — Ambulatory Visit (INDEPENDENT_AMBULATORY_CARE_PROVIDER_SITE_OTHER): Payer: BC Managed Care – PPO | Admitting: Cardiology

## 2020-04-03 ENCOUNTER — Other Ambulatory Visit: Payer: Self-pay

## 2020-04-03 ENCOUNTER — Encounter: Payer: Self-pay | Admitting: Cardiology

## 2020-04-03 VITALS — BP 118/75 | HR 89 | Ht 59.0 in | Wt 163.4 lb

## 2020-04-03 DIAGNOSIS — Z79899 Other long term (current) drug therapy: Secondary | ICD-10-CM

## 2020-04-03 DIAGNOSIS — E0500 Thyrotoxicosis with diffuse goiter without thyrotoxic crisis or storm: Secondary | ICD-10-CM | POA: Diagnosis not present

## 2020-04-03 DIAGNOSIS — I493 Ventricular premature depolarization: Secondary | ICD-10-CM

## 2020-04-03 DIAGNOSIS — R002 Palpitations: Secondary | ICD-10-CM

## 2020-04-03 NOTE — Patient Instructions (Signed)
Medication Instructions:  Your physician recommends that you continue on your current medications as directed. Please refer to the Current Medication list given to you today.  *If you need a refill on your cardiac medications before your next appointment, please call your pharmacy*   Lab Work: None If you have labs (blood work) drawn today and your tests are completely normal, you will receive your results only by: . MyChart Message (if you have MyChart) OR . A paper copy in the mail If you have any lab test that is abnormal or we need to change your treatment, we will call you to review the results.   Testing/Procedures: Your physician has requested that you have an echocardiogram. Echocardiography is a painless test that uses sound waves to create images of your heart. It provides your doctor with information about the size and shape of your heart and how well your heart's chambers and valves are working. This procedure takes approximately one hour. There are no restrictions for this procedure.  A zio monitor was ordered today. It will remain on for 3 days. You will then return monitor and event diary in provided box. It takes 1-2 weeks for report to be downloaded and returned to us. We will call you with the results. If monitor falls off or has orange flashing light, please call Zio for further instructions.      Follow-Up: At [facility], you and your health needs are our priority.  As part of our continuing mission to provide you with exceptional heart care, we have created designated Provider Care Teams.  These Care Teams include your primary Cardiologist (physician) and Advanced Practice Providers (APPs -  Physician Assistants and Nurse Practitioners) who all work together to provide you with the care you need, when you need it.  We recommend signing up for the patient portal called "MyChart".  Sign up information is provided on this After Visit Summary.  MyChart is used to connect  with patients for Virtual Visits (Telemedicine).  Patients are able to view lab/test results, encounter notes, upcoming appointments, etc.  Non-urgent messages can be sent to your provider as well.   To learn more about what you can do with MyChart, go to [email or web address].    Your next appointment:   6 week(s)  The format for your next appointment:   In Person  Provider:   Brian Munley, MD   Other Instructions   

## 2020-04-17 DIAGNOSIS — R002 Palpitations: Secondary | ICD-10-CM | POA: Diagnosis not present

## 2020-04-21 ENCOUNTER — Other Ambulatory Visit: Payer: Self-pay

## 2020-04-21 ENCOUNTER — Ambulatory Visit (INDEPENDENT_AMBULATORY_CARE_PROVIDER_SITE_OTHER): Payer: BC Managed Care – PPO

## 2020-04-21 DIAGNOSIS — I493 Ventricular premature depolarization: Secondary | ICD-10-CM | POA: Diagnosis not present

## 2020-04-21 DIAGNOSIS — R002 Palpitations: Secondary | ICD-10-CM | POA: Diagnosis not present

## 2020-04-21 DIAGNOSIS — Z79899 Other long term (current) drug therapy: Secondary | ICD-10-CM | POA: Diagnosis not present

## 2020-04-21 DIAGNOSIS — E0500 Thyrotoxicosis with diffuse goiter without thyrotoxic crisis or storm: Secondary | ICD-10-CM | POA: Diagnosis not present

## 2020-04-21 LAB — ECHOCARDIOGRAM COMPLETE
Area-P 1/2: 3.68 cm2
S' Lateral: 2.5 cm

## 2020-04-21 NOTE — Progress Notes (Signed)
Complete echocardiogram performed.  Jimmy Ezana Hubbert RDCS, RVT  

## 2020-04-24 ENCOUNTER — Telehealth: Payer: Self-pay | Admitting: General Practice

## 2020-04-24 NOTE — Telephone Encounter (Signed)
Patient contacted and echocardiogram reviewed.  She expressed understanding.  She indicates that she has been meeting with endocrinology who are reluctant to proceed with further interventions related to her thyroid disease.  She wishes to have a consult with EP.  Please schedule an appointment in [place] for EP consult and evaluation.  Thank you.

## 2020-04-25 ENCOUNTER — Other Ambulatory Visit: Payer: Self-pay

## 2020-04-25 DIAGNOSIS — I493 Ventricular premature depolarization: Secondary | ICD-10-CM

## 2020-04-25 NOTE — Telephone Encounter (Signed)
Message sent to Dr. Hulen Shouts nurse for a referral to Dr. Elberta Fortis  be put in.

## 2020-05-01 ENCOUNTER — Telehealth: Payer: Self-pay

## 2020-05-01 NOTE — Telephone Encounter (Signed)
Per note from Dr. Dulce Sellar:  her monitor shows the same high frequency of PVCs I would not restart flecainide. Which she like to see one of the EP doctors ablation is an option for her problem   Patient is already set up to see Dr. Elberta Fortis on 05/22/2020. I tried calling her but it goes to voicemail and her voicemail is full so I can not leave a message.

## 2020-05-22 ENCOUNTER — Other Ambulatory Visit: Payer: Self-pay

## 2020-05-22 ENCOUNTER — Ambulatory Visit: Payer: BC Managed Care – PPO | Admitting: Cardiology

## 2020-05-22 ENCOUNTER — Encounter: Payer: Self-pay | Admitting: Cardiology

## 2020-05-22 VITALS — BP 114/74 | HR 93 | Ht 59.0 in | Wt 169.2 lb

## 2020-05-22 DIAGNOSIS — I493 Ventricular premature depolarization: Secondary | ICD-10-CM | POA: Diagnosis not present

## 2020-05-22 DIAGNOSIS — Z01812 Encounter for preprocedural laboratory examination: Secondary | ICD-10-CM

## 2020-05-22 NOTE — Progress Notes (Signed)
Electrophysiology Office Note   Date:  05/22/2020   ID:  Krista Long, DOB 1985/07/28, MRN [id number]  PCP:  Gordy Councilman, FNP  Cardiologist:  Dulce Sellar Primary Electrophysiologist:  Daylen Hack Jorja Loa, MD    Chief Complaint: PVC   History of Present Illness: Krista Long is a 34 y.o. female who is being seen today for the evaluation of PVC at the request of Dulce Sellar, Iline Oven, MD. Presenting today for electrophysiology evaluation.  She has a history of autoimmune thyroiditis, Graves' disease in remission, obesity, and PVCs.  She wore a cardiac monitor that showed 22% PVC burden.  Today, she denies symptoms of chest pain, shortness of breath, orthopnea, PND, lower extremityedema, claudication, dizziness, presyncope, syncope, bleeding, or neurologic sequela. The patient is tolerating medications without difficulties.  She noticed PVCs quite a while ago.  She was unsure as to the cause of her palpitations, but these were confirmed on her initial cardiology visit.  She has symptoms of shortness of breath, fatigue, and palpitations.  She says it when her PVCs are bad during a particular day, she gets some chest discomfort as well.  She did try flecainide 50 mg, but this did not change her overall symptomatology.  She states that she has 3 young children at home and is having trouble keeping up with them due to her level of fatigue.   Past Medical History:  Diagnosis Date  . Anxiety 11/20/2017  . Chest pain 08/12/2016  . Chronic autoimmune thyroiditis 10/14/2017  . Depression 11/20/2017  . Endometritis following delivery 12/28/2013  . Frequent PVCs 08/12/2016  . Graves disease   . Graves' disease in remission 10/14/2017  . Hashimoto's disease   . History of cesarean section 11/20/2017  . Hyperthyroidism   . Indication for care in labor or delivery 12/22/2013  . Myometritis 12/29/2013  . Obesity (BMI 30-39.9) 07/26/2015  . Personal history of hyperthyroidism 10/14/2017  . Postoperative state  12/23/2013  . Vitamin D deficiency 10/14/2017   Past Surgical History:  Procedure Laterality Date  . CESAREAN SECTION    . CESAREAN SECTION N/A 12/23/2013   Procedure: CESAREAN SECTION;  Surgeon: Loney Laurence, MD;  Location: [facility];  Service: Obstetrics;  Laterality: N/A;  . CHOLECYSTECTOMY    . DILATION AND CURETTAGE OF UTERUS       Current Outpatient Medications  Medication Sig Dispense Refill  . ALPRAZolam (XANAX) 1 MG tablet Take 1 mg by mouth daily as needed.    . APRI 0.15-30 MG-MCG tablet Take 1 tablet by mouth daily.    Marland Kitchen venlafaxine XR (EFFEXOR-XR) 150 MG 24 hr capsule Take 1 capsule by mouth daily.     No current facility-administered medications for this visit.    Allergies:   Cefdinir and Methimazole   Social History:  The patient  reports that she has never smoked. She has never used smokeless tobacco. She reports that she does not drink alcohol and does not use drugs.   Family History:  The patient's family history includes Alzheimer's disease in her paternal grandmother; Hyperlipidemia in her mother.    ROS:  Please see the history of present illness.   Otherwise, review of systems is positive for none.   All other systems are reviewed and negative.    PHYSICAL EXAM: VS:  BP 114/74   Pulse 93   Ht 4\' 11"  (1.499 m)   Wt 169 lb 3.2 oz (76.7 kg)   LMP 05/15/2020   SpO2 99%   BMI  34.17 kg/m  , BMI Body mass index is 34.17 kg/m. GEN: Well nourished, well developed, in no acute distress  HEENT: normal  Neck: no JVD, carotid bruits, or masses Cardiac: irregular; no murmurs, rubs, or gallops,no edema  Respiratory:  clear to auscultation bilaterally, normal work of breathing GI: soft, nontender, nondistended, + BS MS: no deformity or atrophy  Skin: warm and dry Neuro:  Strength and sensation are intact Psych: euthymic mood, full affect  EKG:  EKG is ordered today. Personal review of the ekg ordered shows sinus rhythm, PVCs  Recent Labs: No results found  for requested labs within last 8760 hours.    Lipid Panel  No results found for: CHOL, TRIG, HDL, CHOLHDL, VLDL, LDLCALC, LDLDIRECT   Wt Readings from Last 3 Encounters:  05/22/20 169 lb 3.2 oz (76.7 kg)  04/03/20 163 lb 6.4 oz (74.1 kg)  04/15/19 177 lb 6.4 oz (80.5 kg)      Other studies Reviewed: Additional studies/ records that were reviewed today include: TTE 04/21/20  Review of the above records today demonstrates:  1. Left ventricular ejection fraction, by estimation, is 60 to 65%. The  left ventricle has normal function. The left ventricle has no regional  wall motion abnormalities. Left ventricular diastolic parameters were  normal.  2. Right ventricular systolic function is normal. The right ventricular  size is normal. There is normal pulmonary artery systolic pressure.  3. The mitral valve is normal in structure. No evidence of mitral valve  regurgitation. No evidence of mitral stenosis.  4. The aortic valve is normal in structure. Aortic valve regurgitation is  not visualized. No aortic stenosis is present.  5. The inferior vena cava is normal in size with greater than 50%  respiratory variability, suggesting right atrial pressure of 3 mmHg.   Monitor 04/21/20 personally reviewed frequent and symptomatic PVCs 22.1% burden.    ASSESSMENT AND PLAN:  1.  PVCs: Elevated burden of 22%.  She was initially on flecainide 50 mg without much improvement in her overall symptomatology.  She would like to try to avoid antiarrhythmics if possible.  She does not have confidence that the flecainide would appropriately treat her PVCs.  Due to that, we Chrishun Scheer plan for ablation.  Risks and benefits of been discussed.  Risk include bleeding, tamponade, heart block, stroke, damage to chest organs.  She understands these risks and has agreed to the procedure.  Case discussed with primary cardiology  Current medicines are reviewed at length with the patient today.   The patient does  not have concerns regarding her medicines.  The following changes were made today:  none  Labs/ tests ordered today include:  Orders Placed This Encounter  Procedures  . CBC  . Basic metabolic panel  . EKG 12-Lead     Disposition:   FU with Lawrence Roldan 3 months  Signed, Suellyn Meenan Jorja Loa, MD  05/22/2020 3:29 PM     [facility] [address] [place] [address] [phone] (office) [phone] (fax)

## 2020-05-22 NOTE — Patient Instructions (Signed)
Medication Instructions:  Your physician recommends that you continue on your current medications as directed. Please refer to the Current Medication list given to you today.  *If you need a refill on your cardiac medications before your next appointment, please call your pharmacy*   Lab Work: None ordered If you have labs (blood work) drawn today and your tests are completely normal, you will receive your results only by: Marland Kitchen MyChart Message (if you have MyChart) OR . A paper copy in the mail If you have any lab test that is abnormal or we need to change your treatment, we will call you to review the results.   Testing/Procedures: Your physician has recommended that you have an ablation. Catheter ablation is a medical procedure used to treat some cardiac arrhythmias (irregular heartbeats). During catheter ablation, a long, thin, flexible tube is put into a blood vessel in your groin (upper thigh), or neck. This tube is called an ablation catheter. It is then guided to your heart through the blood vessel. Radio frequency waves destroy small areas of heart tissue where abnormal heartbeats may cause an arrhythmia to start. Please see the instructions below located under "other instructions".   Follow-Up: At [facility], you and your health needs are our priority.  As part of our continuing mission to provide you with exceptional heart care, we have created designated Provider Care Teams.  These Care Teams include your primary Cardiologist (physician) and Advanced Practice Providers (APPs -  Physician Assistants and Nurse Practitioners) who all work together to provide you with the care you need, when you need it.  We recommend signing up for the patient portal called "MyChart".  Sign up information is provided on this After Visit Summary.  MyChart is used to connect with patients for Virtual Visits (Telemedicine).  Patients are able to view lab/test results, encounter notes, upcoming appointments,  etc.  Non-urgent messages can be sent to your provider as well.   To learn more about what you can do with MyChart, go to [email or web address].    Your next appointment:   4 week(s)  The format for your next appointment:   In Person  Provider:   Loman Brooklyn, MD    Thank you for choosing [facility]!!   Dory Horn, RN [phone]   Other Instructions     Electrophysiology/Ablation Procedure Instructions   You are scheduled for a(n)  ablation on 07/04/20 with Dr. Loman Brooklyn.   1.   Pre procedure testing-             A.  LAB WORK --- between 06/05/20 - 06/30/20  for your pre procedure blood work.  You do NOT need to be fasting, avoid going during lunch hours.               B. COVID TEST-- On 07/04/20 @ 11:00 am - This is a Drive Up Visit at [address] [place], [place] [address].  Someone will direct you to the appropriate testing line. Stay in your car and someone will be with you shortly.   After you are tested please go home and self quarantine until the day of your procedure.     2. On the day of your procedure 07/04/2020 you will go to [facility] [phone]) at 8:30 am.  Bonita Quin will go to the main entrance [employer]) and enter where the [employer] are.  Your driver will drop you off and you will head down  the hallway to ADMITTING.  You may have one support person come in to the hospital with you.  They will be asked to wait in the waiting room.   3.   Do not eat or drink after midnight prior to your procedure.   4.   Do NOT take any medications the morning of your procedure.   5.  Plan for an overnight stay, but you may be discharged home after your procedure.    If you use your phone frequently bring your phone charger, in case you have to stay.  If you are discharged after your procedure you will need someone to drive you home and be with your for 24 hours after your procedure.   6. You will follow up with Dr. Elberta Fortis  1 month  after your procedure.  This appointment will be made for you.   * If you have ANY questions please call the office [phone] and ask for Sherri RN or send me a MyChart message   * Occasionally, EP Studies and ablations can become lengthy.  Please make your family aware of this before your procedure starts.  Average time ranges from 2-8 hours for EP studies/ablations.  Your physician will call your family after the procedure with the results.                                     Cardiac Ablation Cardiac ablation is a procedure to disable (ablate) a small amount of heart tissue in very specific places. The heart has many electrical connections. Sometimes these connections are abnormal and can cause the heart to beat very fast or irregularly. Ablating some of the problem areas can improve the heart rhythm or return it to normal. Ablation may be done for people who:  Have Wolff-Parkinson-White syndrome.  Have fast heart rhythms (tachycardia).  Have taken medicines for an abnormal heart rhythm (arrhythmia) that were not effective or caused side effects.  Have a high-risk heartbeat that may be life-threatening. During the procedure, a small incision is made in the neck or the groin, and a long, thin, flexible tube (catheter) is inserted into the incision and moved to the heart. Small devices (electrodes) on the tip of the catheter will send out electrical currents. A type of X-ray (fluoroscopy) will be used to help guide the catheter and to provide images of the heart. Tell a health care provider about:  Any allergies you have.  All medicines you are taking, including vitamins, herbs, eye drops, creams, and over-the-counter medicines.  Any problems you or family members have had with anesthetic medicines.  Any blood disorders you have.  Any surgeries you have had.  Any medical conditions you have, such as kidney failure.  Whether you are pregnant or may be pregnant. What are the  risks? Generally, this is a safe procedure. However, problems may occur, including:  Infection.  Bruising and bleeding at the catheter insertion site.  Bleeding into the chest, especially into the sac that surrounds the heart. This is a serious complication.  Stroke or blood clots.  Damage to other structures or organs.  Allergic reaction to medicines or dyes.  Need for a permanent pacemaker if the normal electrical system is damaged. A pacemaker is a small computer that sends electrical signals to the heart and helps your heart beat normally.  The procedure not being fully effective. This may not be recognized until  months later. Repeat ablation procedures are sometimes required. What happens before the procedure?  Follow instructions from your health care provider about eating or drinking restrictions.  Ask your health care provider about: ? Changing or stopping your regular medicines. This is especially important if you are taking diabetes medicines or blood thinners. ? Taking medicines such as aspirin and ibuprofen. These medicines can thin your blood. Do not take these medicines before your procedure if your health care provider instructs you not to.  Plan to have someone take you home from the hospital or clinic.  If you will be going home right after the procedure, plan to have someone with you for 24 hours. What happens during the procedure?  To lower your risk of infection: ? Your health care team will wash or sanitize their hands. ? Your skin will be washed with soap. ? Hair may be removed from the incision area.  An IV tube will be inserted into one of your veins.  You will be given a medicine to help you relax (sedative).  The skin on your neck or groin will be numbed.  An incision will be made in your neck or your groin.  A needle will be inserted through the incision and into a large vein in your neck or groin.  A catheter will be inserted into the needle  and moved to your heart.  Dye may be injected through the catheter to help your surgeon see the area of the heart that needs treatment.  Electrical currents will be sent from the catheter to ablate heart tissue in desired areas. There are three types of energy that may be used to ablate heart tissue: ? Heat (radiofrequency energy). ? Laser energy. ? Extreme cold (cryoablation).  When the necessary tissue has been ablated, the catheter will be removed.  Pressure will be held on the catheter insertion area to prevent excessive bleeding.  A bandage (dressing) will be placed over the catheter insertion area. The procedure may vary among health care providers and hospitals. What happens after the procedure?  Your blood pressure, heart rate, breathing rate, and blood oxygen level will be monitored until the medicines you were given have worn off.  Your catheter insertion area will be monitored for bleeding. You will need to lie still for a few hours to ensure that you do not bleed from the catheter insertion area.  Do not drive for 24 hours or as long as directed by your health care provider. Summary  Cardiac ablation is a procedure to disable (ablate) a small amount of heart tissue in very specific places. Ablating some of the problem areas can improve the heart rhythm or return it to normal.  During the procedure, electrical currents will be sent from the catheter to ablate heart tissue in desired areas. This information is not intended to replace advice given to you by your health care provider. Make sure you discuss any questions you have with your health care provider. Document Revised: 11/24/2017 Document Reviewed: 04/22/2016 Elsevier Patient Education  2020 [employer].

## 2020-05-23 ENCOUNTER — Ambulatory Visit: Payer: BC Managed Care – PPO | Admitting: Cardiology

## 2020-06-13 ENCOUNTER — Telehealth: Payer: Self-pay | Admitting: Cardiology

## 2020-06-13 NOTE — Telephone Encounter (Signed)
Patient c/o Palpitations:  High priority if patient c/o lightheadedness, shortness of breath, or chest pain  1) How long have you had palpitations/irregular HR/ Afib? Are you having the symptoms now? A few years, patient states she can feel palpitations now.  2) Are you currently experiencing lightheadedness, SOB or CP? Slight chest pain.  3) Do you have a history of afib (atrial fibrillation) or irregular heart rhythm? No.  4) Have you checked your BP or HR? (document readings if available): No.  5) Are you experiencing any other symptoms? Patient called in stating that she is having PVC issues and palpitations. She states that she had bad pvc's and chest pain last night that made her felt like she was being "shocked." Please advise.

## 2020-06-13 NOTE — Telephone Encounter (Signed)
Pt was calling in today to ask Dr. Elberta Fortis and RN if there is something that could be called into her pharmacy to take PRN, for ongoing palpitations and symptomatic PVCs.  Pt states she has a scheduled ablation in January, as ordered by Dr. Elberta Fortis.  Pt states she is scheduled for 07/04/20.  Pt states she is on nothing to assist with PVC burden, and would like to see if Dr. Elberta Fortis could call her in something to her pharmacy to take as needed, or on a scheduled basis.  Pt states her palpitations and PVCs have become uncomfortable for her at times, causing her to have intermittent sob.  Pt states she is asymptomatic at this current time.  Informed the pt that I will route this message to Dr. Elberta Fortis and his RN to further review, advise, and follow-up with the pt thereafter, on this matter.  Pt verbalized understanding and agrees with this plan.

## 2020-06-14 ENCOUNTER — Telehealth: Payer: Self-pay | Admitting: Cardiology

## 2020-06-14 NOTE — Telephone Encounter (Signed)
    Pt is calling to follow up the prescriptions she needed for her palpitations. Advised the nurse still waiting for Dr. Elberta Fortis recommendations and she will get a callback once Dr. Elberta Fortis respond

## 2020-06-15 MED ORDER — DILTIAZEM HCL 30 MG PO TABS: 30.0000 mg | ORAL_TABLET | Freq: Four times a day (QID) | ORAL | 1 refills | Status: DC | PRN

## 2020-06-15 NOTE — Addendum Note (Signed)
Addended by: Baird Lyons on: 06/15/2020 12:18 PM   Modules accepted: Orders

## 2020-06-15 NOTE — Telephone Encounter (Signed)
Apologized to pt for delay. Advised to try Diltiazem 30 mg q6h PRN per Dr. Elberta Fortis. Pt agreeable to plan.  She will call office if ineffective

## 2020-06-22 ENCOUNTER — Other Ambulatory Visit: Payer: Self-pay

## 2020-06-22 DIAGNOSIS — I493 Ventricular premature depolarization: Secondary | ICD-10-CM | POA: Diagnosis not present

## 2020-06-22 DIAGNOSIS — Z01812 Encounter for preprocedural laboratory examination: Secondary | ICD-10-CM | POA: Diagnosis not present

## 2020-06-23 LAB — BASIC METABOLIC PANEL
BUN/Creatinine Ratio: 16 (ref 9–23)
BUN: 9 mg/dL (ref 6–20)
CO2: 21 mmol/L (ref 20–29)
Calcium: 8.8 mg/dL (ref 8.7–10.2)
Chloride: 104 mmol/L (ref 96–106)
Creatinine, Ser: 0.56 mg/dL — ABNORMAL LOW (ref 0.57–1.00)
GFR calc Af Amer: 140 mL/min/{1.73_m2} (ref >59)
GFR calc non Af Amer: 121 mL/min/{1.73_m2} (ref >59)
Glucose: 79 mg/dL (ref 65–99)
Potassium: 4.1 mmol/L (ref 3.5–5.2)
Sodium: 139 mmol/L (ref 134–144)

## 2020-06-23 LAB — CBC
Hematocrit: 41.4 % (ref 34.0–46.6)
Hemoglobin: 13.8 g/dL (ref 11.1–15.9)
MCH: 29.1 pg (ref 26.6–33.0)
MCHC: 33.3 g/dL (ref 31.5–35.7)
MCV: 87 fL (ref 79–97)
Platelets: 248 10*3/uL (ref 150–450)
RBC: 4.75 x10E6/uL (ref 3.77–5.28)
RDW: 11.8 % (ref 11.7–15.4)
WBC: 6.6 10*3/uL (ref 3.4–10.8)

## 2020-07-01 ENCOUNTER — Other Ambulatory Visit (HOSPITAL_COMMUNITY): Payer: BC Managed Care – PPO

## 2020-07-03 ENCOUNTER — Telehealth: Payer: Self-pay | Admitting: Cardiology

## 2020-07-03 NOTE — Telephone Encounter (Signed)
Spoke with pt and advised Dr Elberta Fortis nurse is not in the office today.  Will forward message to RN and pt can expect a call once RN is available.  Pt verbalized understanding and thanked Charity fundraiser for the call.

## 2020-07-03 NOTE — Telephone Encounter (Signed)
    Pt is calling to follow up to r/s her procedure. Also, she is following up a medications Dr. Elberta Fortis wants her to be on while waiting for her procedure.

## 2020-07-04 ENCOUNTER — Encounter (HOSPITAL_COMMUNITY): Payer: Self-pay

## 2020-07-04 ENCOUNTER — Ambulatory Visit (HOSPITAL_COMMUNITY): Admit: 2020-07-04 | Payer: BC Managed Care – PPO | Admitting: Cardiology

## 2020-07-04 SURGERY — PVC ABLATION
Anesthesia: Monitor Anesthesia Care

## 2020-07-05 ENCOUNTER — Telehealth: Payer: Self-pay | Admitting: Cardiology

## 2020-07-05 NOTE — Telephone Encounter (Signed)
STAT if patient feels like he/she is going to faint   1) Are you dizzy now?  2) Do you feel faint or have you passed out? No   3) Do you have any other symptoms? No   4) Have you checked your HR and BP (record if available)? No log available

## 2020-07-05 NOTE — Telephone Encounter (Signed)
Feels as if irregularity coming more regular.  Whenever she has episodes she gets real dizzy almost to the point going to pass out. Sometimes it feels like lightening bolts going through her chest. She is taking Diltiazem PRN.  Advised that I would forward to [facility] for approval of restarting Flecainide at higher dose (100 mg BID) until we can reschedule PVC ablation. Aware I will be in touch once advised. Pt agreeable to plan.

## 2020-07-05 NOTE — Telephone Encounter (Signed)
Patient calling back. She states she has called every day this week and has not heard from the nurse.

## 2020-07-07 MED ORDER — FLECAINIDE ACETATE 50 MG PO TABS: 50.0000 mg | ORAL_TABLET | Freq: Two times a day (BID) | ORAL | 3 refills | Status: DC

## 2020-07-07 NOTE — Telephone Encounter (Addendum)
Returned call to pt, apologized for delay and pt was understandable. Advised to re-start Flecainide 50 mg BID.   Aware that she will need a follow up EKG in 7-10 days after starting (pt going to restart tonight). Aware I will send this to [facility] office and ask them to assist in getting her EKG week after next in their office. Aware I will follow up after EKG is completed to determine if can increase Flecainide to 100 mg BID until such time as procedure can be performed. Patient verbalized understanding and agreeable to plan.

## 2020-07-11 ENCOUNTER — Ambulatory Visit (INDEPENDENT_AMBULATORY_CARE_PROVIDER_SITE_OTHER): Payer: BC Managed Care – PPO

## 2020-07-11 ENCOUNTER — Telehealth: Payer: Self-pay | Admitting: Cardiology

## 2020-07-11 ENCOUNTER — Other Ambulatory Visit: Payer: Self-pay

## 2020-07-11 VITALS — Ht 59.0 in | Wt 169.0 lb

## 2020-07-11 DIAGNOSIS — Z79899 Other long term (current) drug therapy: Secondary | ICD-10-CM

## 2020-07-11 MED ORDER — PROPAFENONE HCL ER 325 MG PO CP12: 325.0000 mg | ORAL_CAPSULE | Freq: Two times a day (BID) | ORAL | 3 refills | Status: DC

## 2020-07-11 NOTE — Telephone Encounter (Signed)
Pt c/o medication issue:  1. Name of Medication: flecainide (TAMBOCOR) 50 MG tablet  2. How are you currently taking this medication (dosage and times per day)? Yes   3. Are you having a reaction (difficulty breathing--STAT)? Yes.    4. What is your medication issue? Patient breaks out in rash when taking medication

## 2020-07-11 NOTE — Telephone Encounter (Signed)
Pt advised to stop Flecainide, start Propafenone 325 mg BID per Dr. Elberta Fortis. Aware I will be in touch about rescheduling procedure. Patient verbalized understanding and agreeable to plan.

## 2020-07-11 NOTE — Telephone Encounter (Signed)
Pt came in for a nurse visit for an EKG. Can you have [name] to review and notify pt if any changes are needed.

## 2020-07-12 DIAGNOSIS — I493 Ventricular premature depolarization: Secondary | ICD-10-CM

## 2020-08-08 ENCOUNTER — Telehealth: Payer: Self-pay | Admitting: *Deleted

## 2020-08-08 NOTE — Telephone Encounter (Signed)
Followed up with pt who reports that she had to call [facility] on her own b/c she never got a call (placed referral beginning of February). She reports that she meets w/ Dr. Lowella Bandy on 3/7 to discuss ablation. Aware that I will let her know if I hear any updates from [facility] on our overnight procedures. Patient verbalized understanding and agreeable to plan.

## 2020-08-17 ENCOUNTER — Telehealth: Payer: Self-pay | Admitting: Cardiology

## 2020-08-17 NOTE — Telephone Encounter (Signed)
New message:      Pt said she had talked to Sherri  Last week about an Ablation. She would like for [facility] to please call her, concerning the Ablation.

## 2020-08-17 NOTE — Telephone Encounter (Signed)
Pt aware that we still don't have approval from hospital for overnight stays in order to perform her needed PVC ablation. Aware that I have sent another message this afternoon to hospital staff to determine if we can start scheduling again. She is aware  I will let he know once I hear from hospital. She appreciates my follow up

## 2020-08-18 NOTE — Telephone Encounter (Signed)
Pt aware we cannot do 3/25, apologized. Will be arrange for following week 4/1. Pt agreeable Will further discuss at [facility] appt.

## 2020-08-18 NOTE — Telephone Encounter (Signed)
Informed pt of good news of clearance to reschedule her PVC ablation - she is acceptable to 3/25. Scheduled to f/u w/ [name] 3/7 for EKG, H&P, labs, etc. Pt agreeable to plan and appreciates the great news.

## 2020-08-21 ENCOUNTER — Other Ambulatory Visit: Payer: Self-pay

## 2020-08-21 ENCOUNTER — Encounter: Payer: Self-pay | Admitting: Cardiology

## 2020-08-21 ENCOUNTER — Ambulatory Visit: Payer: BC Managed Care – PPO | Admitting: Cardiology

## 2020-08-21 VITALS — BP 120/88 | HR 93 | Ht 59.0 in | Wt 178.0 lb

## 2020-08-21 DIAGNOSIS — I493 Ventricular premature depolarization: Secondary | ICD-10-CM

## 2020-08-21 DIAGNOSIS — Z01812 Encounter for preprocedural laboratory examination: Secondary | ICD-10-CM | POA: Diagnosis not present

## 2020-08-21 NOTE — Patient Instructions (Addendum)
Medication Instructions:  Your physician recommends that you continue on your current medications as directed. Please refer to the Current Medication list given to you today.  *If you need a refill on your cardiac medications before your next appointment, please call your pharmacy*   Lab Work: Pre procedure blood work today: BMET & CBC If you have labs (blood work) drawn today and your tests are completely normal, you will receive your results only by: Marland Kitchen MyChart Message (if you have MyChart) OR . A paper copy in the mail If you have any lab test that is abnormal or we need to change your treatment, we will call you to review the results.   Testing/Procedures: Your physician has recommended that you have an ablation. Catheter ablation is a medical procedure used to treat some cardiac arrhythmias (irregular heartbeats). During catheter ablation, a long, thin, flexible tube is put into a blood vessel in your groin (upper thigh), or neck. This tube is called an ablation catheter. It is then guided to your heart through the blood vessel. Radio frequency waves destroy small areas of heart tissue where abnormal heartbeats may cause an arrhythmia to start. Please see the instructions below located under "other instructions".   Follow-Up: At [facility], you and your health needs are our priority.  As part of our continuing mission to provide you with exceptional heart care, we have created designated Provider Care Teams.  These Care Teams include your primary Cardiologist (physician) and Advanced Practice Providers (APPs -  Physician Assistants and Nurse Practitioners) who all work together to provide you with the care you need, when you need it.   Your next appointment:   4 week(s) after your ablation on 09/15/2020  The format for your next appointment:   In Person  Provider:   Loman Brooklyn, MD    Thank you for choosing [facility]!!   Dory Horn, RN [phone]   Other  Instructions    Electrophysiology/Ablation Procedure Instructions   You are scheduled for a(n)  ablation on 09/15/2020 with Dr. Loman Brooklyn.   1.   Pre procedure testing-             A.  LAB WORK --- On 08/21/20  for your pre procedure blood work.  You do NOT need to be fasting.                B. COVID TEST-- On 09/13/2020 @ 2:30 pm - This is a Drive Up Visit at [address] [place], [place] [address].  Someone will direct you to the appropriate testing line. Stay in your car and someone will be with you shortly.   After you are tested please go home and self quarantine until the day of your procedure.     PROCEDURE DAY: 2. On the day of your procedure 09/15/2020 you will go to [facility] [phone]) at 10:30 am.  Bonita Quin will go to the main entrance [employer]) and enter where the [employer] are.  Your driver will drop you off and you will head down the hallway to ADMITTING.  You may have one support person come in to the hospital with you.  They will be asked to wait in the waiting room.  It is OK to have someone drop you off and come back when you are ready to be discharged.   3.   Do not eat or drink after midnight prior to your procedure.   4.  Do NOT take any medications the morning of your procedure.   5.  Plan for an overnight stay, but you may be discharged home after your procedure. If you use your phone frequently bring your phone charger, in case you have to stay.  If you are discharged after your procedure you will need someone to drive you home and be with your for 24 hours after your procedure.   6. You will follow up with Dr. Elberta Fortis 1 month after your procedure.  This appointment will be made for you.   * If you have ANY questions please call the office [phone] and ask for Sennie Borden RN or send me a MyChart message   * Occasionally, EP Studies and ablations can become lengthy.  Please make your family aware of this before your procedure starts.   Average time ranges from 2-8 hours for EP studies/ablations.  Your physician will call your family after the procedure with the results.                                    Cardiac Ablation Cardiac ablation is a procedure to destroy (ablate) some heart tissue that is sending bad signals. These bad signals cause problems in heart rhythm. The heart has many areas that make these signals. If there are problems in these areas, they can make the heart beat in a way that is not normal. Destroying some tissues can help make the heart rhythm normal. Tell your doctor about:  Any allergies you have.  All medicines you are taking. These include vitamins, herbs, eye drops, creams, and over-the-counter medicines.  Any problems you or family members have had with medicines that make you fall asleep (anesthetics).  Any blood disorders you have.  Any surgeries you have had.  Any medical conditions you have, such as kidney failure.  Whether you are pregnant or may be pregnant. What are the risks? This is a safe procedure. But problems may occur, including:  Infection.  Bruising and bleeding.  Bleeding into the chest.  Stroke or blood clots.  Damage to nearby areas of your body.  Allergies to medicines or dyes.  The need for a pacemaker if the normal system is damaged.  Failure of the procedure to treat the problem. What happens before the procedure? Medicines Ask your doctor about:  Changing or stopping your normal medicines. This is important.  Taking aspirin and ibuprofen. Do not take these medicines unless your doctor tells you to take them.  Taking other medicines, vitamins, herbs, and supplements. General instructions  Follow instructions from your doctor about what you cannot eat or drink.  Plan to have someone take you home from the hospital or clinic.  If you will be going home right after the procedure, plan to have someone with you for 24 hours.  Ask your doctor what  steps will be taken to prevent infection. What happens during the procedure?  An IV tube will be put into one of your veins.  You will be given a medicine to help you relax.  The skin on your neck or groin will be numbed.  A cut (incision) will be made in your neck or groin. A needle will be put through your cut and into a large vein.  A tube (catheter) will be put into the needle. The tube will be moved to your heart.  Dye may be put through the tube.  This helps your doctor see your heart.  Small devices (electrodes) on the tube will send out signals.  A type of energy will be used to destroy some heart tissue.  The tube will be taken out.  Pressure will be held on your cut. This helps stop bleeding.  A bandage will be put over your cut. The exact procedure may vary among doctors and hospitals.   What happens after the procedure?  You will be watched until you leave the hospital or clinic. This includes checking your heart rate, breathing rate, oxygen, and blood pressure.  Your cut will be watched for bleeding. You will need to lie still for a few hours.  Do not drive for 24 hours or as long as your doctor tells you. Summary  Cardiac ablation is a procedure to destroy some heart tissue. This is done to treat heart rhythm problems.  Tell your doctor about any medical conditions you may have. Tell him or her about all medicines you are taking to treat them.  This is a safe procedure. But problems may occur. These include infection, bruising, bleeding, and damage to nearby areas of your body.  Follow what your doctor tells you about food and drink. You may also be told to change or stop some of your medicines.  After the procedure, do not drive for 24 hours or as long as your doctor tells you. This information is not intended to replace advice given to you by your health care provider. Make sure you discuss any questions you have with your health care provider. Document  Revised: 05/06/2019 Document Reviewed: 05/06/2019 Elsevier Patient Education  2021 [employer].

## 2020-08-21 NOTE — H&P (View-Only) (Signed)
Electrophysiology Office Note   Date:  08/21/2020   ID:  Krista Long, DOB 07/07/1985, MRN [id number]  PCP:  Gordy Councilman, FNP  Cardiologist:  Dulce Sellar Primary Electrophysiologist:  Arneshia Ade Jorja Loa, MD    Chief Complaint: PVC   History of Present Illness: Krista Long is a 35 y.o. female who is being seen today for the evaluation of PVC at the request of Gordy Councilman, FNP. Presenting today for electrophysiology evaluation.  She has a history significant for autoimmune thyroiditis, Graves' disease in remission, obesity, and PVCs.  She wore a cardiac monitor that showed a 22% PVC burden.  She was initially on flecainide, but this did not control her symptoms.  At this point, she would prefer ablation of her PVCs.  Today, denies symptoms of palpitations, chest pain, shortness of breath, orthopnea, PND, lower extremity edema, claudication, dizziness, presyncope, syncope, bleeding, or neurologic sequela. The patient is tolerating medications without difficulties.  Since last being seen, she has continued to have episodic PVCs.  Her main symptoms are weakness, fatigue.  She does not get short of breath she also gets nauseous at times.  She feels PVCs mainly when she is at rest.   Past Medical History:  Diagnosis Date  . Anxiety 11/20/2017  . Chest pain 08/12/2016  . Chronic autoimmune thyroiditis 10/14/2017  . Depression 11/20/2017  . Endometritis following delivery 12/28/2013  . Frequent PVCs 08/12/2016  . Graves disease   . Graves' disease in remission 10/14/2017  . Hashimoto's disease   . History of cesarean section 11/20/2017  . Hyperthyroidism   . Indication for care in labor or delivery 12/22/2013  . Myometritis 12/29/2013  . Obesity (BMI 30-39.9) 07/26/2015  . Personal history of hyperthyroidism 10/14/2017  . Postoperative state 12/23/2013  . Vitamin D deficiency 10/14/2017   Past Surgical History:  Procedure Laterality Date  . CESAREAN SECTION    . CESAREAN SECTION N/A  12/23/2013   Procedure: CESAREAN SECTION;  Surgeon: Loney Laurence, MD;  Location: [facility];  Service: Obstetrics;  Laterality: N/A;  . CHOLECYSTECTOMY    . DILATION AND CURETTAGE OF UTERUS       Current Outpatient Medications  Medication Sig Dispense Refill  . ALPRAZolam (XANAX) 1 MG tablet Take 1 mg by mouth at bedtime as needed for sleep.    . APRI 0.15-30 MG-MCG tablet Take 1 tablet by mouth daily.    Marland Kitchen diltiazem (CARDIZEM) 30 MG tablet Take 1 tablet (30 mg total) by mouth 4 (four) times daily as needed. (Patient taking differently: Take 30 mg by mouth 4 (four) times daily as needed (rapid heartbeat, lightheadedness).) 30 tablet 1  . propafenone (RYTHMOL SR) 325 MG 12 hr capsule Take 1 capsule (325 mg total) by mouth 2 (two) times daily. 60 capsule 3  . venlafaxine XR (EFFEXOR-XR) 150 MG 24 hr capsule Take 150 mg by mouth daily.     No current facility-administered medications for this visit.    Allergies:   Cefdinir, Methimazole, and Scopolamine   Social History:  The patient  reports that she has never smoked. She has never used smokeless tobacco. She reports that she does not drink alcohol and does not use drugs.   Family History:  The patient's family history includes Alzheimer's disease in her paternal grandmother; Hyperlipidemia in her mother.   ROS:  Please see the history of present illness.   Otherwise, review of systems is positive for none.   All other systems are reviewed and negative.  PHYSICAL EXAM: VS:  BP 120/88   Pulse 93   Ht 4\' 11"  (1.499 m)   Wt 178 lb (80.7 kg)   SpO2 97%   BMI 35.95 kg/m  , BMI Body mass index is 35.95 kg/m. GEN: Well nourished, well developed, in no acute distress  HEENT: normal  Neck: no JVD, carotid bruits, or masses Cardiac: irregular; no murmurs, rubs, or gallops,no edema  Respiratory:  clear to auscultation bilaterally, normal work of breathing GI: soft, nontender, nondistended, + BS MS: no deformity or atrophy  Skin: warm  and dry Neuro:  Strength and sensation are intact Psych: euthymic mood, full affect  EKG:  EKG is ordered today. Personal review of the ekg ordered shows sinus rhythm, PVCs   Recent Labs: 06/22/2020: BUN 9; Creatinine, Ser 0.56; Hemoglobin 13.8; Platelets 248; Potassium 4.1; Sodium 139    Lipid Panel  No results found for: CHOL, TRIG, HDL, CHOLHDL, VLDL, LDLCALC, LDLDIRECT   Wt Readings from Last 3 Encounters:  08/21/20 178 lb (80.7 kg)  07/11/20 169 lb (76.7 kg)  05/22/20 169 lb 3.2 oz (76.7 kg)      Other studies Reviewed: Additional studies/ records that were reviewed today include: TTE 04/21/20  Review of the above records today demonstrates:  1. Left ventricular ejection fraction, by estimation, is 60 to 65%. The  left ventricle has normal function. The left ventricle has no regional  wall motion abnormalities. Left ventricular diastolic parameters were  normal.  2. Right ventricular systolic function is normal. The right ventricular  size is normal. There is normal pulmonary artery systolic pressure.  3. The mitral valve is normal in structure. No evidence of mitral valve  regurgitation. No evidence of mitral stenosis.  4. The aortic valve is normal in structure. Aortic valve regurgitation is  not visualized. No aortic stenosis is present.  5. The inferior vena cava is normal in size with greater than 50%  respiratory variability, suggesting right atrial pressure of 3 mmHg.   Monitor 04/21/20 personally reviewed frequent and symptomatic PVCs 22.1% burden.    ASSESSMENT AND PLAN:  1.  PVCs: Elevated burden of 22%.  She was initially on flecainide without much improvement in her overall symptomatology.  She is currently on propafenone, but has continued to have symptoms.  High risk medication monitoring.  She would prefer ablation.  Risks and benefits of been discussed risk of bleeding, tamponade, heart block, stroke, damage to chest organs.  She understands these  risks and is agreed to the procedure.    Current medicines are reviewed at length with the patient today.   The patient does not have concerns regarding her medicines.  The following changes were made today:  none  Labs/ tests ordered today include:  Orders Placed This Encounter  Procedures  . Basic metabolic panel  . CBC  . EKG 12-Lead     Disposition:   FU with Lizann Edelman 3 months  Signed, Shianne Zeiser 13/5/21, MD  08/21/2020 3:31 PM     [facility] [address] [place] [phone] (office) [phone] (fax)

## 2020-08-21 NOTE — Progress Notes (Signed)
 Electrophysiology Office Note   Date:  08/21/2020   ID:  Krista Long, DOB 10/16/1985, MRN [id number]  PCP:  Perry, Melisa W, FNP  Cardiologist:  Munley Primary Electrophysiologist:  Hasini Peachey Martin Laneya Gasaway, MD    Chief Complaint: PVC   History of Present Illness: Krista Long is a 34 y.o. female who is being seen today for the evaluation of PVC at the request of Perry, Melisa W, FNP. Presenting today for electrophysiology evaluation.  She has a history significant for autoimmune thyroiditis, Graves' disease in remission, obesity, and PVCs.  She wore a cardiac monitor that showed a 22% PVC burden.  She was initially on flecainide, but this did not control her symptoms.  At this point, she would prefer ablation of her PVCs.  Today, denies symptoms of palpitations, chest pain, shortness of breath, orthopnea, PND, lower extremity edema, claudication, dizziness, presyncope, syncope, bleeding, or neurologic sequela. The patient is tolerating medications without difficulties.  Since last being seen, she has continued to have episodic PVCs.  Her main symptoms are weakness, fatigue.  She does not get short of breath she also gets nauseous at times.  She feels PVCs mainly when she is at rest.   Past Medical History:  Diagnosis Date  . Anxiety 11/20/2017  . Chest pain 08/12/2016  . Chronic autoimmune thyroiditis 10/14/2017  . Depression 11/20/2017  . Endometritis following delivery 12/28/2013  . Frequent PVCs 08/12/2016  . Graves disease   . Graves' disease in remission 10/14/2017  . Hashimoto's disease   . History of cesarean section 11/20/2017  . Hyperthyroidism   . Indication for care in labor or delivery 12/22/2013  . Myometritis 12/29/2013  . Obesity (BMI 30-39.9) 07/26/2015  . Personal history of hyperthyroidism 10/14/2017  . Postoperative state 12/23/2013  . Vitamin D deficiency 10/14/2017   Past Surgical History:  Procedure Laterality Date  . CESAREAN SECTION    . CESAREAN SECTION N/A  12/23/2013   Procedure: CESAREAN SECTION;  Surgeon: Michelle A Horvath, MD;  Location: [facility];  Service: Obstetrics;  Laterality: N/A;  . CHOLECYSTECTOMY    . DILATION AND CURETTAGE OF UTERUS       Current Outpatient Medications  Medication Sig Dispense Refill  . ALPRAZolam (XANAX) 1 MG tablet Take 1 mg by mouth at bedtime as needed for sleep.    . APRI 0.15-30 MG-MCG tablet Take 1 tablet by mouth daily.    . diltiazem (CARDIZEM) 30 MG tablet Take 1 tablet (30 mg total) by mouth 4 (four) times daily as needed. (Patient taking differently: Take 30 mg by mouth 4 (four) times daily as needed (rapid heartbeat, lightheadedness).) 30 tablet 1  . propafenone (RYTHMOL SR) 325 MG 12 hr capsule Take 1 capsule (325 mg total) by mouth 2 (two) times daily. 60 capsule 3  . venlafaxine XR (EFFEXOR-XR) 150 MG 24 hr capsule Take 150 mg by mouth daily.     No current facility-administered medications for this visit.    Allergies:   Cefdinir, Methimazole, and Scopolamine   Social History:  The patient  reports that she has never smoked. She has never used smokeless tobacco. She reports that she does not drink alcohol and does not use drugs.   Family History:  The patient's family history includes Alzheimer's disease in her paternal grandmother; Hyperlipidemia in her mother.   ROS:  Please see the history of present illness.   Otherwise, review of systems is positive for none.   All other systems are reviewed and negative.     PHYSICAL EXAM: VS:  BP 120/88   Pulse 93   Ht 4\' 11"  (1.499 m)   Wt 178 lb (80.7 kg)   SpO2 97%   BMI 35.95 kg/m  , BMI Body mass index is 35.95 kg/m. GEN: Well nourished, well developed, in no acute distress  HEENT: normal  Neck: no JVD, carotid bruits, or masses Cardiac: irregular; no murmurs, rubs, or gallops,no edema  Respiratory:  clear to auscultation bilaterally, normal work of breathing GI: soft, nontender, nondistended, + BS MS: no deformity or atrophy  Skin: warm  and dry Neuro:  Strength and sensation are intact Psych: euthymic mood, full affect  EKG:  EKG is ordered today. Personal review of the ekg ordered shows sinus rhythm, PVCs   Recent Labs: 06/22/2020: BUN 9; Creatinine, Ser 0.56; Hemoglobin 13.8; Platelets 248; Potassium 4.1; Sodium 139    Lipid Panel  No results found for: CHOL, TRIG, HDL, CHOLHDL, VLDL, LDLCALC, LDLDIRECT   Wt Readings from Last 3 Encounters:  08/21/20 178 lb (80.7 kg)  07/11/20 169 lb (76.7 kg)  05/22/20 169 lb 3.2 oz (76.7 kg)      Other studies Reviewed: Additional studies/ records that were reviewed today include: TTE 04/21/20  Review of the above records today demonstrates:  1. Left ventricular ejection fraction, by estimation, is 60 to 65%. The  left ventricle has normal function. The left ventricle has no regional  wall motion abnormalities. Left ventricular diastolic parameters were  normal.  2. Right ventricular systolic function is normal. The right ventricular  size is normal. There is normal pulmonary artery systolic pressure.  3. The mitral valve is normal in structure. No evidence of mitral valve  regurgitation. No evidence of mitral stenosis.  4. The aortic valve is normal in structure. Aortic valve regurgitation is  not visualized. No aortic stenosis is present.  5. The inferior vena cava is normal in size with greater than 50%  respiratory variability, suggesting right atrial pressure of 3 mmHg.   Monitor 04/21/20 personally reviewed frequent and symptomatic PVCs 22.1% burden.    ASSESSMENT AND PLAN:  1.  PVCs: Elevated burden of 22%.  She was initially on flecainide without much improvement in her overall symptomatology.  She is currently on propafenone, but has continued to have symptoms.  High risk medication monitoring.  She would prefer ablation.  Risks and benefits of been discussed risk of bleeding, tamponade, heart block, stroke, damage to chest organs.  She understands these  risks and is agreed to the procedure.    Current medicines are reviewed at length with the patient today.   The patient does not have concerns regarding her medicines.  The following changes were made today:  none  Labs/ tests ordered today include:  Orders Placed This Encounter  Procedures  . Basic metabolic panel  . CBC  . EKG 12-Lead     Disposition:   FU with Amarah Brossman 3 months  Signed, Jennet Scroggin 13/5/21, MD  08/21/2020 3:31 PM     [facility] [address] [place] [phone] (office) [phone] (fax)

## 2020-08-22 LAB — BASIC METABOLIC PANEL
BUN/Creatinine Ratio: 15 (ref 9–23)
BUN: 11 mg/dL (ref 6–20)
CO2: 18 mmol/L — ABNORMAL LOW (ref 20–29)
Calcium: 9.3 mg/dL (ref 8.7–10.2)
Chloride: 106 mmol/L (ref 96–106)
Creatinine, Ser: 0.73 mg/dL (ref 0.57–1.00)
Glucose: 78 mg/dL (ref 65–99)
Potassium: 4.1 mmol/L (ref 3.5–5.2)
Sodium: 141 mmol/L (ref 134–144)
eGFR: 110 mL/min/{1.73_m2} (ref >59)

## 2020-08-22 LAB — CBC
Hematocrit: 40.1 % (ref 34.0–46.6)
Hemoglobin: 13.5 g/dL (ref 11.1–15.9)
MCH: 29.2 pg (ref 26.6–33.0)
MCHC: 33.7 g/dL (ref 31.5–35.7)
MCV: 87 fL (ref 79–97)
Platelets: 287 10*3/uL (ref 150–450)
RBC: 4.62 x10E6/uL (ref 3.77–5.28)
RDW: 12.2 % (ref 11.7–15.4)
WBC: 8.3 10*3/uL (ref 3.4–10.8)

## 2020-08-30 DIAGNOSIS — Z79899 Other long term (current) drug therapy: Secondary | ICD-10-CM | POA: Diagnosis not present

## 2020-08-30 DIAGNOSIS — E559 Vitamin D deficiency, unspecified: Secondary | ICD-10-CM | POA: Diagnosis not present

## 2020-08-30 DIAGNOSIS — F410 Panic disorder [episodic paroxysmal anxiety] without agoraphobia: Secondary | ICD-10-CM | POA: Diagnosis not present

## 2020-08-30 DIAGNOSIS — R002 Palpitations: Secondary | ICD-10-CM | POA: Diagnosis not present

## 2020-09-13 ENCOUNTER — Other Ambulatory Visit (HOSPITAL_COMMUNITY)
Admission: RE | Admit: 2020-09-13 | Discharge: 2020-09-13 | Disposition: A | Discharge status: Home / Self Care | Payer: BC Managed Care – PPO | Source: Ambulatory Visit | Attending: Cardiology | Admitting: Cardiology

## 2020-09-13 DIAGNOSIS — Z20822 Contact with and (suspected) exposure to covid-19: Secondary | ICD-10-CM | POA: Insufficient documentation

## 2020-09-13 DIAGNOSIS — Z01812 Encounter for preprocedural laboratory examination: Secondary | ICD-10-CM | POA: Diagnosis not present

## 2020-09-13 LAB — SARS CORONAVIRUS 2 (TAT 6-24 HRS): SARS Coronavirus 2: NEGATIVE

## 2020-09-15 ENCOUNTER — Encounter (HOSPITAL_COMMUNITY): Payer: Self-pay | Admitting: Cardiology

## 2020-09-15 ENCOUNTER — Ambulatory Visit (HOSPITAL_COMMUNITY)
Admission: RE | Admit: 2020-09-15 | Discharge: 2020-09-16 | Disposition: A | Discharge status: Home / Self Care | Payer: BC Managed Care – PPO | Source: Ambulatory Visit | Attending: Cardiology | Admitting: Cardiology

## 2020-09-15 ENCOUNTER — Other Ambulatory Visit: Payer: Self-pay

## 2020-09-15 ENCOUNTER — Ambulatory Visit (HOSPITAL_COMMUNITY): Payer: BC Managed Care – PPO | Admitting: Certified Registered Nurse Anesthetist

## 2020-09-15 ENCOUNTER — Ambulatory Visit (HOSPITAL_COMMUNITY)
Admission: RE | Disposition: A | Discharge status: Home / Self Care | Payer: Self-pay | Source: Ambulatory Visit | Attending: Cardiology

## 2020-09-15 DIAGNOSIS — I493 Ventricular premature depolarization: Secondary | ICD-10-CM

## 2020-09-15 DIAGNOSIS — E559 Vitamin D deficiency, unspecified: Secondary | ICD-10-CM | POA: Diagnosis not present

## 2020-09-15 DIAGNOSIS — E039 Hypothyroidism, unspecified: Secondary | ICD-10-CM | POA: Diagnosis not present

## 2020-09-15 DIAGNOSIS — Z79899 Other long term (current) drug therapy: Secondary | ICD-10-CM | POA: Diagnosis not present

## 2020-09-15 DIAGNOSIS — Z881 Allergy status to other antibiotic agents status: Secondary | ICD-10-CM | POA: Diagnosis not present

## 2020-09-15 DIAGNOSIS — F418 Other specified anxiety disorders: Secondary | ICD-10-CM | POA: Diagnosis not present

## 2020-09-15 HISTORY — PX: PVC ABLATION: EP1236

## 2020-09-15 HISTORY — DX: Ventricular premature depolarization: I49.3

## 2020-09-15 LAB — PREGNANCY, URINE: Preg Test, Ur: NEGATIVE

## 2020-09-15 SURGERY — PVC ABLATION
Anesthesia: Monitor Anesthesia Care

## 2020-09-15 MED ORDER — BUPIVACAINE HCL (PF) 0.25 % IJ SOLN
INTRAMUSCULAR | Status: AC
  Filled 2020-09-15: qty 60

## 2020-09-15 MED ORDER — SODIUM CHLORIDE 0.9 % IV SOLN: 250.0000 mL | INTRAVENOUS | Status: DC | PRN

## 2020-09-15 MED ORDER — SODIUM CHLORIDE 0.9% FLUSH: 3.0000 mL | INTRAVENOUS | Status: DC | PRN

## 2020-09-15 MED ORDER — SODIUM CHLORIDE 0.9 % IV SOLN: INTRAVENOUS | Status: DC

## 2020-09-15 MED ORDER — ISOPROTERENOL HCL 0.2 MG/ML IJ SOLN
INTRAMUSCULAR | Status: AC
  Filled 2020-09-15: qty 5

## 2020-09-15 MED ORDER — HEPARIN (PORCINE) IN NACL 1000-0.9 UT/500ML-% IV SOLN
INTRAVENOUS | Status: DC | PRN
  Administered 2020-09-15 (×2): 500 mL

## 2020-09-15 MED ORDER — VENLAFAXINE HCL ER 150 MG PO CP24
150.0000 mg | ORAL_CAPSULE | Freq: Every evening | ORAL | Status: DC
  Administered 2020-09-15: 150 mg via ORAL
  Filled 2020-09-15 (×2): qty 1

## 2020-09-15 MED ORDER — SODIUM CHLORIDE 0.9 % IV SOLN: INTRAVENOUS | Status: DC | PRN

## 2020-09-15 MED ORDER — ACETAMINOPHEN 325 MG PO TABS
650.0000 mg | ORAL_TABLET | ORAL | Status: DC | PRN
  Administered 2020-09-15 – 2020-09-16 (×3): 650 mg via ORAL
  Filled 2020-09-15 (×4): qty 2

## 2020-09-15 MED ORDER — HEPARIN SODIUM (PORCINE) 1000 UNIT/ML IJ SOLN
INTRAMUSCULAR | Status: DC | PRN
  Administered 2020-09-15: 1000 [IU] via INTRAVENOUS

## 2020-09-15 MED ORDER — FENTANYL CITRATE (PF) 100 MCG/2ML IJ SOLN
INTRAMUSCULAR | Status: DC | PRN
  Administered 2020-09-15 (×2): 25 ug via INTRAVENOUS
  Administered 2020-09-15: 50 ug via INTRAVENOUS
  Administered 2020-09-15 (×4): 25 ug via INTRAVENOUS

## 2020-09-15 MED ORDER — ONDANSETRON HCL 4 MG/2ML IJ SOLN
INTRAMUSCULAR | Status: DC | PRN
  Administered 2020-09-15: 4 mg via INTRAVENOUS

## 2020-09-15 MED ORDER — DESOGESTREL-ETHINYL ESTRADIOL 0.15-30 MG-MCG PO TABS: 1.0000 | ORAL_TABLET | Freq: Every day | ORAL | Status: DC

## 2020-09-15 MED ORDER — FENTANYL CITRATE (PF) 100 MCG/2ML IJ SOLN
25.0000 ug | INTRAMUSCULAR | Status: DC | PRN
Start: 2020-09-15 — End: 2020-09-15

## 2020-09-15 MED ORDER — PROMETHAZINE HCL 25 MG/ML IJ SOLN
6.2500 mg | INTRAMUSCULAR | Status: DC | PRN
Start: 2020-09-15 — End: 2020-09-15
  Filled 2020-09-15: qty 1

## 2020-09-15 MED ORDER — HEPARIN (PORCINE) IN NACL 1000-0.9 UT/500ML-% IV SOLN
INTRAVENOUS | Status: AC
  Filled 2020-09-15: qty 500

## 2020-09-15 MED ORDER — ALPRAZOLAM 0.5 MG PO TABS
1.0000 mg | ORAL_TABLET | Freq: Every evening | ORAL | Status: DC | PRN
  Administered 2020-09-15: 1 mg via ORAL
  Filled 2020-09-15: qty 2

## 2020-09-15 MED ORDER — ONDANSETRON HCL 4 MG/2ML IJ SOLN: 4.0000 mg | Freq: Four times a day (QID) | INTRAMUSCULAR | Status: DC | PRN

## 2020-09-15 MED ORDER — SODIUM CHLORIDE 0.9% FLUSH
3.0000 mL | Freq: Two times a day (BID) | INTRAVENOUS | Status: DC
  Administered 2020-09-15: 3 mL via INTRAVENOUS

## 2020-09-15 MED ORDER — HEPARIN SODIUM (PORCINE) 1000 UNIT/ML IJ SOLN
INTRAMUSCULAR | Status: AC
  Filled 2020-09-15: qty 1

## 2020-09-15 MED ORDER — MIDAZOLAM HCL 5 MG/5ML IJ SOLN
INTRAMUSCULAR | Status: DC | PRN
  Administered 2020-09-15: .5 mg via INTRAVENOUS
  Administered 2020-09-15: 1 mg via INTRAVENOUS
  Administered 2020-09-15: .5 mg via INTRAVENOUS
  Administered 2020-09-15: 1 mg via INTRAVENOUS

## 2020-09-15 SURGICAL SUPPLY — 16 items
BAG SNAP BAND KOVER 36X36 (MISCELLANEOUS) ×2 IMPLANT
BLANKET WARM UNDERBOD FULL ACC (MISCELLANEOUS) ×2 IMPLANT
CATH 8FR REPROCESSED SOUNDSTAR (CATHETERS) ×3 IMPLANT
CATH 8FR SOUNDSTAR REPROCESSED (CATHETERS) IMPLANT
CATH JOSEPH QUAD ALLRED 6F REP (CATHETERS) ×3 IMPLANT
CATH SMTCH THERMOCOOL SF DF (CATHETERS) ×2 IMPLANT
CLOSURE PERCLOSE PROSTYLE (VASCULAR PRODUCTS) ×9 IMPLANT
PACK EP LATEX FREE (CUSTOM PROCEDURE TRAY) ×3
PACK EP LF (CUSTOM PROCEDURE TRAY) ×1 IMPLANT
PAD PRO RADIOLUCENT 2001M-C (PAD) ×3 IMPLANT
PATCH CARTO3 (PAD) ×2 IMPLANT
SHEATH PINNACLE 6F 10CM (SHEATH) ×2 IMPLANT
SHEATH PINNACLE 8F 10CM (SHEATH) ×2 IMPLANT
SHEATH PINNACLE 9F 10CM (SHEATH) ×2 IMPLANT
SHEATH PROBE COVER 6X72 (BAG) ×2 IMPLANT
TUBING SMART ABLATE COOLFLOW (TUBING) ×2 IMPLANT

## 2020-09-15 NOTE — Anesthesia Procedure Notes (Signed)
Procedure Name: MAC Date/Time: 09/15/2020 12:54 PM Performed by: Janene Harvey, CRNA Pre-anesthesia Checklist: Patient identified, Emergency Drugs available, Suction available and Patient being monitored Patient Re-evaluated:Patient Re-evaluated prior to induction Oxygen Delivery Method: Nasal cannula Placement Confirmation: positive ETCO2 Dental Injury: Teeth and Oropharynx as per pre-operative assessment

## 2020-09-15 NOTE — Transfer of Care (Signed)
Immediate Anesthesia Transfer of Care Note  Patient: Krista Long  Procedure(s) Performed: PVC ABLATION (N/A )  Patient Location: Cath Lab  Anesthesia Type:MAC  Level of Consciousness: awake, alert  and patient cooperative  Airway & Oxygen Therapy: Patient Spontanous Breathing and Patient connected to nasal cannula oxygen  Post-op Assessment: Report given to RN and Post -op Vital signs reviewed and stable  Post vital signs: Reviewed  Last Vitals:  Vitals Value Taken Time  BP 141/96 09/15/20 1511  Temp    Pulse 91 09/15/20 1515  Resp 20 09/15/20 1515  SpO2 96 % 09/15/20 1515  Vitals shown include unvalidated device data.  Last Pain:  Vitals:   09/15/20 1514  TempSrc:   PainSc: 0-No pain         Complications: No complications documented.

## 2020-09-15 NOTE — Interval H&P Note (Signed)
History and Physical Interval Note:  09/15/2020 12:00 PM  Krista Long  has presented today for surgery, with the diagnosis of pvc.  The various methods of treatment have been discussed with the patient and family. After consideration of risks, benefits and other options for treatment, the patient has consented to  Procedure(s): PVC ABLATION (N/A) as a surgical intervention.  The patient's history has been reviewed, patient examined, no change in status, stable for surgery.  I have reviewed the patient's chart and labs.  Questions were answered to the patient's satisfaction.     Brinleigh Tew [employer]

## 2020-09-15 NOTE — Anesthesia Preprocedure Evaluation (Addendum)
Anesthesia Evaluation  Patient identified by MRN, date of birth, ID band Patient awake    Reviewed: Allergy & Precautions, NPO status , Patient's Chart, lab work & pertinent test results  Airway Mallampati: II  TM Distance: >3 FB Neck ROM: Full    Dental  (+) Teeth Intact, Dental Advisory Given   Pulmonary neg pulmonary ROS,    Pulmonary exam normal breath sounds clear to auscultation       Cardiovascular Pt. on medications Normal cardiovascular exam Rhythm:Regular Rate:Normal  Echo 04/21/20:  1. Left ventricular ejection fraction, by estimation, is 60 to 65%. The  left ventricle has normal function. The left ventricle has no regional  wall motion abnormalities. Left ventricular diastolic parameters were  normal.  2. Right ventricular systolic function is normal. The right ventricular  size is normal. There is normal pulmonary artery systolic pressure.  3. The mitral valve is normal in structure. No evidence of mitral valve  regurgitation. No evidence of mitral stenosis.  4. The aortic valve is normal in structure. Aortic valve regurgitation is  not visualized. No aortic stenosis is present.  5. The inferior vena cava is normal in size with greater than 50%  respiratory variability, suggesting right atrial pressure of 3 mmHg.    Neuro/Psych PSYCHIATRIC DISORDERS Anxiety Depression negative neurological ROS     GI/Hepatic negative GI ROS, Neg liver ROS,   Endo/Other  Obesity   Renal/GU negative Renal ROS     Musculoskeletal negative musculoskeletal ROS (+)   Abdominal   Peds  Hematology negative hematology ROS (+)   Anesthesia Other Findings Day of surgery medications reviewed with the patient.  Reproductive/Obstetrics                             Anesthesia Physical Anesthesia Plan  ASA: II  Anesthesia Plan: MAC   Post-op Pain Management:    Induction: Intravenous  PONV  Risk Score and Plan: 2 and Midazolam and Propofol infusion  Airway Management Planned: Nasal Cannula and Natural Airway  Additional Equipment:   Intra-op Plan:   Post-operative Plan:   Informed Consent: I have reviewed the patients History and Physical, chart, labs and discussed the procedure including the risks, benefits and alternatives for the proposed anesthesia with the patient or authorized representative who has indicated his/her understanding and acceptance.     Dental advisory given  Plan Discussed with: CRNA and Anesthesiologist  Anesthesia Plan Comments:         Anesthesia Quick Evaluation

## 2020-09-15 NOTE — Anesthesia Postprocedure Evaluation (Signed)
Anesthesia Post Note  Patient: Krista Long  Procedure(s) Performed: PVC ABLATION (N/A )     Patient location during evaluation: Cath Lab Anesthesia Type: MAC Level of consciousness: awake and alert Pain management: pain level controlled Vital Signs Assessment: post-procedure vital signs reviewed and stable Respiratory status: spontaneous breathing, nonlabored ventilation and respiratory function stable Cardiovascular status: stable and blood pressure returned to baseline Postop Assessment: no apparent nausea or vomiting Anesthetic complications: no   No complications documented.  Last Vitals:  Vitals:   09/15/20 1525 09/15/20 1530  BP: 129/84 123/75  Pulse: 82 79  Resp: 11 16  Temp:    SpO2: 98% 98%    Last Pain:  Vitals:   09/15/20 1514  TempSrc:   PainSc: 0-No pain                 Catalina Gravel

## 2020-09-15 NOTE — Discharge Instructions (Signed)
Post procedure care instructions No driving for 4 days. No lifting over 5 lbs for 1 week. No vigorous or sexual activity for 1 week. You may return to work/your usual activities on 09/22/20. Keep procedure site clean & dry. If you notice increased pain, swelling, bleeding or pus, call/return!  You may shower after 24 hours, but no soaking in baths/hot tubs/pools for 1 week.

## 2020-09-16 DIAGNOSIS — Z79899 Other long term (current) drug therapy: Secondary | ICD-10-CM | POA: Diagnosis not present

## 2020-09-16 DIAGNOSIS — Z881 Allergy status to other antibiotic agents status: Secondary | ICD-10-CM | POA: Diagnosis not present

## 2020-09-16 DIAGNOSIS — I493 Ventricular premature depolarization: Secondary | ICD-10-CM | POA: Diagnosis not present

## 2020-09-16 NOTE — Progress Notes (Signed)
      Patient Care Team: Gordy Councilman, FNP as PCP - General (Family Medicine) Regan Lemming, MD as PCP - Electrophysiology (Cardiology)   HPI  Krista Long is a 35 y.o. female underwent PVC ablation yesterday by Dr. Derek Jack.  Some soreness at her groin-right and mild pleuritic pain no shortness of breath  Records and Results Reviewed*   Past Medical History:  Diagnosis Date  . Anxiety 11/20/2017  . Chest pain 08/12/2016  . Chronic autoimmune thyroiditis 10/14/2017  . Depression 11/20/2017  . Endometritis following delivery 12/28/2013  . Frequent PVCs 08/12/2016  . Graves disease   . Graves' disease in remission 10/14/2017  . Hashimoto's disease   . History of cesarean section 11/20/2017  . Hyperthyroidism   . Indication for care in labor or delivery 12/22/2013  . Myometritis 12/29/2013  . Obesity (BMI 30-39.9) 07/26/2015  . Personal history of hyperthyroidism 10/14/2017  . Postoperative state 12/23/2013  . Vitamin D deficiency 10/14/2017    Past Surgical History:  Procedure Laterality Date  . CESAREAN SECTION    . CESAREAN SECTION N/A 12/23/2013   Procedure: CESAREAN SECTION;  Surgeon: Loney Laurence, MD;  Location: [facility];  Service: Obstetrics;  Laterality: N/A;  . CHOLECYSTECTOMY    . DILATION AND CURETTAGE OF UTERUS      Current Meds  Medication Sig  . ALPRAZolam (XANAX) 1 MG tablet Take 1 mg by mouth at bedtime as needed for sleep.  . APRI 0.15-30 MG-MCG tablet Take 1 tablet by mouth daily.  Marland Kitchen diltiazem (CARDIZEM) 30 MG tablet Take 1 tablet (30 mg total) by mouth 4 (four) times daily as needed. (Patient taking differently: Take 30 mg by mouth 4 (four) times daily as needed (rapid heartbeat, lightheadedness).)  . venlafaxine XR (EFFEXOR-XR) 150 MG 24 hr capsule Take 150 mg by mouth daily.    Allergies  Allergen Reactions  . Cefdinir Shortness Of Breath  . Methimazole Hives  . Scopolamine Other (See Comments)    Confusion and memory loss   . Tambocor [Flecainide]  Hives      Review of Systems negative except from HPI and PMH  Physical Exam BP (!) 116/99 (BP Location: Right Arm)   Pulse 97   Temp 98.1 F (36.7 C) (Oral)   Resp 17   Ht 4\' 11"  (1.499 m)   Wt 77.1 kg   LMP 07/10/2020   SpO2 100%   BMI 34.34 kg/m  Well developed and well nourished in no acute distress HENT normal E scleral and icterus clear Neck Supple JVP flat;   Clear to ausculation Regular rate and rhythm, no murmurs gallops or rub Soft with active bowel sounds No clubbing cyanosis edema Localized fullness over the right femoral area without bruit; tender and tender on the left but less so Alert and oriented, grossly normal motor and sensory function Skin Warm and Dry  Telemetry was reviewed.  Scant PVCs  CrCl cannot be calculated (Patient's most recent lab result is older than the maximum 21 days allowed.).   Assessment and  Plan  PVCs status post ablation  Groin tenderness mild hematoma   Patient can be discharged today.  We will stop propafenone.  Reviewed that she should call 07/12/2020 if she has more chest discomfort particularly associated with dyspnea or more groin discomfort and/or swelling    Current medicines are reviewed at length with the patient today .  The patient does not  have concerns regarding medicines.

## 2020-09-16 NOTE — Discharge Summary (Signed)
Discharge Summary    Patient ID: Krista Long MRN: [id number]; DOB: 1986-01-05  Admit date: 09/15/2020 Discharge date: 09/16/2020  PCP:  Gordy Councilman, FNP   [facility]  Cardiologist:  Norman Herrlich, MD  Electrophysiologist:  Regan Lemming, MD    Discharge Diagnoses    Active Problems:   PVC (premature ventricular contraction)   Diagnostic Studies/Procedures    PVC Ablation: 09/15/2020  PROCEDURES: 1. Comprehensive electrophysiologic study. 2. Coronary sinus pacing and recording. 3. Three-dimensional mapping of atrial fibrillation (with additional mapping and ablation within the right ventricle due to PVCs  4. Ablation of PVCs 5. Intracardiac echocardiography.  INTRODUCTION:  Krista Long is a 35 y.o. female with a history of PVCs who now presents for EP study and radiofrequency ablation.  The patient reports initially being diagnosed with PVCs after presenting with symptomatic palpitations and fatgiue.  The patient has failed medical therapy.  The patient therefore presents today for catheter ablation of PVCs.  DESCRIPTION OF PROCEDURE:  Informed written consent was obtained, and the patient was brought to the electrophysiology lab in a fasting state.  The patient was adequately sedated with intravenous medications as outlined in the anesthesia report.  The patient's left and right groins were prepped and draped in the usual sterile fashion by the EP lab staff.  Using a percutaneous Seldinger technique, one 6 [place] and 1 8 [place] hemostasis sheath were placed in the right common femoral vein, 1 8 French hemostasis sheath was placed in the left common femoral vein.   Direct ultrasound guidance is used for right and left femoral veins with normal vessel patency. Ultrasound images are captured and stored in the patient's chart. Using ultrasound guidance, the Brockenbrough needle and wire were visualized entering the vessel.  Catheter  Placement:  A 6-French Biosense Webster Decapolar coronary sinus catheter was introduced through the right common femoral vein and advanced into the right ventricle for recording and pacing from this location.      Initial Measurements: The patient presented to the electrophysiology lab in sinus rhythm.  The patient's PR interval measured 104 ms, QRS 90 ms, QT 359 ms, RR 569 ms.  The patient's AH interval was 81 ms, HV interval 37 ms.  Intracardiac Echocardiography: A 10-French Biosense Webster AcuNav intracardiac echocardiography catheter was introduced through the right common femoral vein and advanced into the right ventricle. Intracardiac echocardiography was performed of the left atrium, and a three-dimensional anatomical rendering of the right and left ventricle was performed using CARTO sound technology.     3D Mapping and Ablation: A 3.5 mm Biosense [employer] Thermocool ablation catheter was advanced into the right ventricle.  3D electroanatomic mapping was performed of the right ventricle using activation mapping.  Mapping in the body of the right ventricle showed no early signals associated with PVCs.  The ablation catheter was placed into the right ventricular outflow tract anterior in the right ventricular outflow tract, all PVCs were post QRS.  The ablation catheter was moved to the posterior RVOT.  In this area, local signals were on time with PVCs.  An area inferior to the pulmonic valve, posterior septal showed local signals 30 ms earlier than the QRS during PVCs.  Ablation was initially performed in this area using 25 W.  PVCs were suppressed, but came back after waiting 10 to 20 seconds.  Further ablation was performed in this area using 30 W.  PVCs were suppressed for 2 to 3 minutes.  PVCs did return, and the ablation catheter was pulled down more inferior in this area.  With ablation in that area, PVCs were suppressed.  After waiting time of 30 minutes, no further PVCs  occurred.  Measurements Following Ablation: In sinus rhythm the RR interval was 668 msec, with PR 120 msec, QRS 68 msec, and QT 372 msec. Ventricular pacing was performed, which revealed midline decremental VA conduction with a VA Wenckebach cycle length of 380 msec.  Rapid atrial pacing was performed, which revealed an AV Wenckebach cycle length of 360 msec.  Electroisolation was then again confirmed in all four pulmonary veins.  The procedure was therefore considered completed.  All catheters were removed, and the sheaths were aspirated and flushed.  The patient was transferred to the recovery area for sheath removal per protocol.  Intracardiac echocardiogram revealed no pericardial effusion. EBL<33ml.  There were no early apparent complications.  CONCLUSIONS: 1.  PVCs upon presentation.   2. Successful electrical ablation of PVCs 3. No early apparent complications.   History of Present Illness     Krista Long is a 35 y.o. female with past medical history of symptomatic PVC's and Grave's Disease who presented to [facility] on 09/15/2020 for planned PVC ablation.   She was initially evaluated by Dr. Elberta Fortis in 05/2020 as recent event monitor had demonstrated a 22% PVC burden. She had initially been on Flecainide with minimal improvement in her symptoms, therefore PVC ablation was recommended. Her procedure was initially going to be in 06/2020 but she had to reschedule. Was evaluated in clinic again on 08/21/2020 and reported still having palpitations with associated weakness and fatigue, therefore PVC ablation was rescheduled.   Hospital Course     Consultants: None  She underwent an EP study on 4/1 with details as outlined and was the procedure was successful. After 30 minutes of waiting time, no further PVC's occurred following ablation and no early complications were noted.   She was last examined by Dr. Graciela Husbands and deemed stable for discharge. Maintained NSR overnight and the morning  of discharge. Was recommended to stop Cardizem and Propafenone (patient reported not taking this prior to admission). She does have scheduled follow-up with Dr. Elberta Fortis next month.   _____________  Discharge Physical Exam and Vitals Blood pressure (!) 116/99, pulse 97, temperature 98.1 F (36.7 C), temperature source Oral, resp. rate 17, height 4\' 11"  (1.499 m), weight 77.1 kg, last menstrual period 07/10/2020, SpO2 100 %, currently breastfeeding.  Filed Weights   09/15/20 1030  Weight: 77.1 kg    General: Pleasant female appearing in NAD Psych: Normal affect. Neuro: Alert and oriented X 3. Moves all extremities spontaneously. HEENT: Normal  Neck: Supple without bruits or JVD. Lungs:  Resp regular and unlabored, CTA without wheezing or rales. Heart: RRR no s3, s4, or murmurs. Abdomen: Soft, non-tender, non-distended, BS + x 4.  Extremities: No clubbing, cyanosis or lower extremity edema. DP/PT/Radials 2+ and equal bilaterally.   Labs & Radiologic Studies    CBC No results for input(s): WBC, NEUTROABS, HGB, HCT, MCV, PLT in the last 72 hours. Basic Metabolic Panel No results for input(s): NA, K, CL, CO2, GLUCOSE, BUN, CREATININE, CALCIUM, MG, PHOS in the last 72 hours. Liver Function Tests No results for input(s): AST, ALT, ALKPHOS, BILITOT, PROT, ALBUMIN in the last 72 hours. No results for input(s): LIPASE, AMYLASE in the last 72 hours. High Sensitivity Troponin:   No results for input(s): TROPONINIHS in the last 720 hours.  BNP Invalid  input(s): POCBNP D-Dimer No results for input(s): DDIMER in the last 72 hours. Hemoglobin A1C No results for input(s): HGBA1C in the last 72 hours. Fasting Lipid Panel No results for input(s): CHOL, HDL, LDLCALC, TRIG, CHOLHDL, LDLDIRECT in the last 72 hours. Thyroid Function Tests No results for input(s): TSH, T4TOTAL, T3FREE, THYROIDAB in the last 72 hours.  Invalid input(s): FREET3 _____________    Disposition   Pt is being  discharged home today in good condition.  Follow-up Plans & Appointments     Follow-up Information    Regan Lemming, MD Follow up.   Specialty: Cardiology Why: 10/16/20 @ 2:30PM Contact information: [address] [place] [address] [phone]              Discharge Instructions    Diet - low sodium heart healthy   Complete by: As directed    Discharge instructions   Complete by: As directed    PLEASE REMEMBER TO BRING ALL OF YOUR MEDICATIONS TO EACH OF YOUR FOLLOW-UP OFFICE VISITS.  PLEASE ATTEND ALL SCHEDULED FOLLOW-UP APPOINTMENTS.   Activity: Increase activity slowly as tolerated. You may shower, but no soaking baths (or swimming) for 1 week. No driving for 24 hours. No lifting over 5 lbs for 1 week. No sexual activity for 1 week.    Wound Care: You may wash site gently with soap and water. Keep site clean and dry. If you notice pain, swelling, bleeding or pus at your cath site, please call [phone].   Increase activity slowly   Complete by: As directed       Discharge Medications   Allergies as of 09/16/2020      Reactions   Cefdinir Shortness Of Breath   Methimazole Hives   Scopolamine Other (See Comments)   Confusion and memory loss    Tambocor [flecainide] Hives      Medication List    STOP taking these medications   diltiazem 30 MG tablet Commonly known as: Cardizem   propafenone 325 MG 12 hr capsule Commonly known as: RYTHMOL SR     TAKE these medications   ALPRAZolam 1 MG tablet Commonly known as: XANAX Take 1 mg by mouth at bedtime as needed for sleep.   Apri 0.15-30 MG-MCG tablet Generic drug: desogestrel-ethinyl estradiol Take 1 tablet by mouth daily.   venlafaxine XR 150 MG 24 hr capsule Commonly known as: EFFEXOR-XR Take 150 mg by mouth daily.          Outstanding Labs/Studies   None  Duration of Discharge Encounter   Greater than 30 minutes including physician time.  Signed, Ellsworth Lennox,  PA-C 09/16/2020, 1:03 PM

## 2020-09-17 MED FILL — Bupivacaine HCl Preservative Free (PF) Inj 0.25%: INTRAMUSCULAR | Qty: 30 | Status: AC

## 2020-09-18 ENCOUNTER — Encounter (HOSPITAL_COMMUNITY): Payer: Self-pay | Admitting: Cardiology

## 2020-10-16 ENCOUNTER — Ambulatory Visit: Payer: BC Managed Care – PPO | Admitting: Cardiology

## 2020-10-16 ENCOUNTER — Encounter: Payer: Self-pay | Admitting: Cardiology

## 2020-10-16 ENCOUNTER — Other Ambulatory Visit: Payer: Self-pay

## 2020-10-16 VITALS — BP 128/80 | HR 102 | Ht 59.0 in | Wt 181.6 lb

## 2020-10-16 DIAGNOSIS — I493 Ventricular premature depolarization: Secondary | ICD-10-CM

## 2020-10-16 NOTE — Patient Instructions (Signed)
Medication Instructions:  Your physician recommends that you continue on your current medications as directed. Please refer to the Current Medication list given to you today.  *If you need a refill on your cardiac medications before your next appointment, please call your pharmacy*   Lab Work: None ordered   Testing/Procedures: None ordered   Follow-Up: At [facility], you and your health needs are our priority.  As part of our continuing mission to provide you with exceptional heart care, we have created designated Provider Care Teams.  These Care Teams include your primary Cardiologist (physician) and Advanced Practice Providers (APPs -  Physician Assistants and Nurse Practitioners) who all work together to provide you with the care you need, when you need it.   Your next appointment:   3 month(s)  The format for your next appointment:   In Person  Provider:   Will [name], MD    Thank you for choosing [facility]!!   Opel Lejeune, RN [phone]  Other Instructions             

## 2020-10-16 NOTE — Progress Notes (Signed)
Electrophysiology Office Note   Date:  10/16/2020   ID:  Krista Long, DOB 1985/10/02, MRN [id number]  PCP:  Krista Councilman, FNP  Cardiologist:  Krista Long Primary Electrophysiologist:  Sabella Traore Krista Loa, MD    Chief Complaint: PVC   History of Present Illness: Krista Long is a 35 y.o. female who is being seen today for the evaluation of PVC at the request of Krista Councilman, FNP. Presenting today for electrophysiology evaluation.  She has a history significant for autoimmune thyroiditis, Graves' disease in remission, obesity, and PVCs.  She wore a cardiac monitor that showed a 22% PVC burden.  She was initially put on flecainide but this did not control her symptoms.  She is now status post PVC ablation 09/15/2020 with ablation in the posterior RVOT.  Today, denies symptoms of palpitations, chest pain, shortness of breath, orthopnea, PND, lower extremity edema, claudication, dizziness, presyncope, syncope, bleeding, or neurologic sequela. The patient is tolerating medications without difficulties.  Since her ablation she has done well.  She has noted no further PVCs.  She has noticed some rapid heart rates at times.  These occur at all times of the day.  She does feel like she has more energy.   Past Medical History:  Diagnosis Date  . Anxiety 11/20/2017  . Chest pain 08/12/2016  . Chronic autoimmune thyroiditis 10/14/2017  . Depression 11/20/2017  . Endometritis following delivery 12/28/2013  . Frequent PVCs 08/12/2016  . Graves disease   . Graves' disease in remission 10/14/2017  . Hashimoto's disease   . History of cesarean section 11/20/2017  . Hyperthyroidism   . Indication for care in labor or delivery 12/22/2013  . Myometritis 12/29/2013  . Obesity (BMI 30-39.9) 07/26/2015  . Personal history of hyperthyroidism 10/14/2017  . Postoperative state 12/23/2013  . Vitamin D deficiency 10/14/2017   Past Surgical History:  Procedure Laterality Date  . CESAREAN SECTION    . CESAREAN  SECTION N/A 12/23/2013   Procedure: CESAREAN SECTION;  Surgeon: Loney Laurence, MD;  Location: [facility];  Service: Obstetrics;  Laterality: N/A;  . CHOLECYSTECTOMY    . DILATION AND CURETTAGE OF UTERUS    . PVC ABLATION N/A 09/15/2020   Procedure: PVC ABLATION;  Surgeon: Regan Lemming, MD;  Location: [facility];  Service: Cardiovascular;  Laterality: N/A;     Current Outpatient Medications  Medication Sig Dispense Refill  . ALPRAZolam (XANAX) 1 MG tablet Take 1 mg by mouth at bedtime as needed for sleep.    . APRI 0.15-30 MG-MCG tablet Take 1 tablet by mouth daily.    Marland Kitchen venlafaxine XR (EFFEXOR-XR) 150 MG 24 hr capsule Take 150 mg by mouth daily.     No current facility-administered medications for this visit.    Allergies:   Cefdinir, Methimazole, Scopolamine, and Tambocor [flecainide]   Social History:  The patient  reports that she has never smoked. She has never used smokeless tobacco. She reports that she does not drink alcohol and does not use drugs.   Family History:  The patient's family history includes Alzheimer's disease in her paternal grandmother; Hyperlipidemia in her mother.   ROS:  Please see the history of present illness.   Otherwise, review of systems is positive for none.   All other systems are reviewed and negative.   PHYSICAL EXAM: VS:  BP 128/80   Pulse (!) 102   Ht 4\' 11"  (1.499 m)   Wt 181 lb 9.6 oz (82.4 kg)  SpO2 98%   BMI 36.68 kg/m  , BMI Body mass index is 36.68 kg/m. GEN: Well nourished, well developed, in no acute distress  HEENT: normal  Neck: no JVD, carotid bruits, or masses Cardiac: RRR; no murmurs, rubs, or gallops,no edema  Respiratory:  clear to auscultation bilaterally, normal work of breathing GI: soft, nontender, nondistended, + BS MS: no deformity or atrophy  Skin: warm and dry Neuro:  Strength and sensation are intact Psych: euthymic mood, full affect  EKG:  EKG is ordered today. Personal review of the ekg  ordered shows sinus tachycardia, rate 102  Recent Labs: 08/21/2020: BUN 11; Creatinine, Ser 0.73; Hemoglobin 13.5; Platelets 287; Potassium 4.1; Sodium 141    Lipid Panel  No results found for: CHOL, TRIG, HDL, CHOLHDL, VLDL, LDLCALC, LDLDIRECT   Wt Readings from Last 3 Encounters:  10/16/20 181 lb 9.6 oz (82.4 kg)  09/15/20 170 lb (77.1 kg)  08/21/20 178 lb (80.7 kg)      Other studies Reviewed: Additional studies/ records that were reviewed today include: TTE 04/21/20  Review of the above records today demonstrates:  1. Left ventricular ejection fraction, by estimation, is 60 to 65%. The  left ventricle has normal function. The left ventricle has no regional  wall motion abnormalities. Left ventricular diastolic parameters were  normal.  2. Right ventricular systolic function is normal. The right ventricular  size is normal. There is normal pulmonary artery systolic pressure.  3. The mitral valve is normal in structure. No evidence of mitral valve  regurgitation. No evidence of mitral stenosis.  4. The aortic valve is normal in structure. Aortic valve regurgitation is  not visualized. No aortic stenosis is present.  5. The inferior vena cava is normal in size with greater than 50%  respiratory variability, suggesting right atrial pressure of 3 mmHg.   Monitor 04/21/20 personally reviewed frequent and symptomatic PVCs 22.1% burden.    ASSESSMENT AND PLAN:  1.  PVCs: Elevated burden of 22% on cardiac monitor.  She is status post ablation 09/15/2020.  Ablation was in the posterior RVOT.  She is not having any PVCs today.  She does feel like her heart is racing, which she is felt since the ablation.  Told her that we can either look into it now with an echo and a possible monitor, or give it a few months to see if this improves.  She Krista Long like to hold off on further evaluation.  I Krista Long see her back in 3 months.  Current medicines are reviewed at length with the patient today.    The patient does not have concerns regarding her medicines.  The following changes were made today: None  Labs/ tests ordered today include:  Orders Placed This Encounter  Procedures  . EKG 12-Lead     Disposition:   FU with Parth Mccormac 3 months  Signed, Brynlie Daza Krista Loa, MD  10/16/2020 2:44 PM     [facility] [address] [place] [address] [phone] (office) [phone] (fax)

## 2020-11-09 DIAGNOSIS — E063 Autoimmune thyroiditis: Secondary | ICD-10-CM | POA: Diagnosis not present

## 2020-11-10 NOTE — Telephone Encounter (Signed)
Pt reports "50 cent piece" size groin know. It has not gotten any bigger. Advised pt to go to urgent care/ED over weekend if worsens. Scheduled to see Dr. Elberta Fortis Tuesday to assess site further. Advised that if improves over the weekend and she doesn't feel appt next week needed to contact me and we could possible cancel that appt. Patient verbalized understanding and agreeable to plan.

## 2020-11-14 ENCOUNTER — Ambulatory Visit: Payer: BC Managed Care – PPO | Admitting: Cardiology

## 2020-11-15 DIAGNOSIS — E0500 Thyrotoxicosis with diffuse goiter without thyrotoxic crisis or storm: Secondary | ICD-10-CM | POA: Diagnosis not present

## 2020-11-15 DIAGNOSIS — E063 Autoimmune thyroiditis: Secondary | ICD-10-CM | POA: Diagnosis not present

## 2020-12-29 DIAGNOSIS — F410 Panic disorder [episodic paroxysmal anxiety] without agoraphobia: Secondary | ICD-10-CM | POA: Diagnosis not present

## 2020-12-29 DIAGNOSIS — E0590 Thyrotoxicosis, unspecified without thyrotoxic crisis or storm: Secondary | ICD-10-CM | POA: Diagnosis not present

## 2020-12-29 DIAGNOSIS — R002 Palpitations: Secondary | ICD-10-CM | POA: Diagnosis not present

## 2020-12-29 DIAGNOSIS — Z6836 Body mass index (BMI) 36.0-36.9, adult: Secondary | ICD-10-CM | POA: Diagnosis not present

## 2021-01-24 DIAGNOSIS — F410 Panic disorder [episodic paroxysmal anxiety] without agoraphobia: Secondary | ICD-10-CM | POA: Diagnosis not present

## 2021-01-29 ENCOUNTER — Encounter: Payer: Self-pay | Admitting: Cardiology

## 2021-01-29 ENCOUNTER — Ambulatory Visit (INDEPENDENT_AMBULATORY_CARE_PROVIDER_SITE_OTHER): Payer: BC Managed Care – PPO | Admitting: Cardiology

## 2021-01-29 ENCOUNTER — Other Ambulatory Visit: Payer: Self-pay

## 2021-01-29 VITALS — BP 106/70 | HR 87 | Ht 59.0 in | Wt 183.0 lb

## 2021-01-29 DIAGNOSIS — I493 Ventricular premature depolarization: Secondary | ICD-10-CM

## 2021-01-29 NOTE — Progress Notes (Signed)
Electrophysiology Office Note   Date:  01/29/2021   ID:  Krista Long, DOB 12-05-1985, MRN [id number]  PCP:  Gordy Councilman, FNP  Cardiologist:  Dulce Sellar Primary Electrophysiologist:  Tryone Kille Jorja Loa, MD    Chief Complaint: PVC   History of Present Illness: Krista Long is a 35 y.o. female who is being seen today for the evaluation of PVC at the request of Gordy Councilman, FNP. Presenting today for electrophysiology evaluation.  She has a history significant for autoimmune thyroiditis, Graves' disease in remission, obesity, PVCs.  She wore a cardiac monitor that showed a 22% PVC burden.  She was initially put on flecainide but this did not control her symptoms.  She has now status post PVC ablation on 09/15/2020 with ablation in the posterior RVOT.  Today, denies symptoms of palpitations, chest pain, shortness of breath, orthopnea, PND, lower extremity edema, claudication, dizziness, presyncope, syncope, bleeding, or neurologic sequela. The patient is tolerating medications without difficulties.  She is overall doing well.  She is unaware of further PVCs.  She does state that her heart rate is fast at times, though likely sinus rhythm and sinus tachycardia.  She does have some sharp pains in the upper part of her chest that lasts a second or 2.  Aside from that, she has no complaints.   Past Medical History:  Diagnosis Date   Anxiety 11/20/2017   Chest pain 08/12/2016   Chronic autoimmune thyroiditis 10/14/2017   Depression 11/20/2017   Endometritis following delivery 12/28/2013   Frequent PVCs 08/12/2016   Graves disease    Graves' disease in remission 10/14/2017   Hashimoto's disease    History of cesarean section 11/20/2017   Hyperthyroidism    Indication for care in labor or delivery 12/22/2013   Myometritis 12/29/2013   Obesity (BMI 30-39.9) 07/26/2015   Personal history of hyperthyroidism 10/14/2017   Postoperative state 12/23/2013   Vitamin D deficiency 10/14/2017   Past  Surgical History:  Procedure Laterality Date   CESAREAN SECTION     CESAREAN SECTION N/A 12/23/2013   Procedure: CESAREAN SECTION;  Surgeon: Loney Laurence, MD;  Location: [facility];  Service: Obstetrics;  Laterality: N/A;   CHOLECYSTECTOMY     DILATION AND CURETTAGE OF UTERUS     PVC ABLATION N/A 09/15/2020   Procedure: PVC ABLATION;  Surgeon: Regan Lemming, MD;  Location: [facility];  Service: Cardiovascular;  Laterality: N/A;     Current Outpatient Medications  Medication Sig Dispense Refill   ALPRAZolam (XANAX) 1 MG tablet Take 1 mg by mouth at bedtime as needed for sleep.     escitalopram (LEXAPRO) 10 MG tablet Take 10 mg by mouth daily.     No current facility-administered medications for this visit.    Allergies:   Cefdinir, Methimazole, Scopolamine, and Tambocor [flecainide]   Social History:  The patient  reports that she has never smoked. She has never used smokeless tobacco. She reports that she does not drink alcohol and does not use drugs.   Family History:  The patient's family history includes Alzheimer's disease in her paternal grandmother; Hyperlipidemia in her mother.   ROS:  Please see the history of present illness.   Otherwise, review of systems is positive for none.   All other systems are reviewed and negative.   PHYSICAL EXAM: VS:  BP 106/70   Pulse 87   Ht 4\' 11"  (1.499 m)   Wt 183 lb (83 kg)   SpO2 96%  BMI 36.96 kg/m  , BMI Body mass index is 36.96 kg/m. GEN: Well nourished, well developed, in no acute distress  HEENT: normal  Neck: no JVD, carotid bruits, or masses Cardiac: RRR; no murmurs, rubs, or gallops,no edema  Respiratory:  clear to auscultation bilaterally, normal work of breathing GI: soft, nontender, nondistended, + BS MS: no deformity or atrophy  Skin: warm and dry Neuro:  Strength and sensation are intact Psych: euthymic mood, full affect  EKG:  EKG is ordered today. Personal review of the ekg ordered shows sinus  rhythm, rate 87  Recent Labs: 08/21/2020: BUN 11; Creatinine, Ser 0.73; Hemoglobin 13.5; Platelets 287; Potassium 4.1; Sodium 141    Lipid Panel  No results found for: CHOL, TRIG, HDL, CHOLHDL, VLDL, LDLCALC, LDLDIRECT   Wt Readings from Last 3 Encounters:  01/29/21 183 lb (83 kg)  10/16/20 181 lb 9.6 oz (82.4 kg)  09/15/20 170 lb (77.1 kg)      Other studies Reviewed: Additional studies/ records that were reviewed today include: TTE 04/21/20  Review of the above records today demonstrates:   1. Left ventricular ejection fraction, by estimation, is 60 to 65%. The  left ventricle has normal function. The left ventricle has no regional  wall motion abnormalities. Left ventricular diastolic parameters were  normal.   2. Right ventricular systolic function is normal. The right ventricular  size is normal. There is normal pulmonary artery systolic pressure.   3. The mitral valve is normal in structure. No evidence of mitral valve  regurgitation. No evidence of mitral stenosis.   4. The aortic valve is normal in structure. Aortic valve regurgitation is  not visualized. No aortic stenosis is present.   5. The inferior vena cava is normal in size with greater than 50%  respiratory variability, suggesting right atrial pressure of 3 mmHg.   Monitor 04/21/20 personally reviewed frequent and symptomatic PVCs 22.1% burden.    ASSESSMENT AND PLAN:  1.  PVCs: Elevated burden at 22%.  She is status post ablation 09/15/2020.  Ablation was posterior RVOT.  No PVCs on her ECG today.  She is having some chest discomfort which feels like a shocking type discomfort in the upper part of her chest that lasts a second or 2.  I have ensured her that this is unlikely to be cardiac in nature.  Current medicines are reviewed at length with the patient today.   The patient does not have concerns regarding her medicines.  The following changes were made today: None  Labs/ tests ordered today include:   Orders Placed This Encounter  Procedures   EKG 12-Lead      Disposition:   FU with Daylynn Stumpp 12 months  Signed, Karyss Frese Jorja Loa, MD  01/29/2021 3:54 PM     [facility] [address] [place] [address] [phone] (office) [phone] (fax)

## 2021-02-14 DIAGNOSIS — Z6836 Body mass index (BMI) 36.0-36.9, adult: Secondary | ICD-10-CM | POA: Diagnosis not present

## 2021-02-14 DIAGNOSIS — Z01419 Encounter for gynecological examination (general) (routine) without abnormal findings: Secondary | ICD-10-CM | POA: Diagnosis not present

## 2021-03-12 DIAGNOSIS — M79672 Pain in left foot: Secondary | ICD-10-CM | POA: Diagnosis not present

## 2021-03-12 DIAGNOSIS — M25572 Pain in left ankle and joints of left foot: Secondary | ICD-10-CM | POA: Diagnosis not present

## 2021-03-12 DIAGNOSIS — S80211A Abrasion, right knee, initial encounter: Secondary | ICD-10-CM | POA: Diagnosis not present

## 2021-03-12 DIAGNOSIS — Z043 Encounter for examination and observation following other accident: Secondary | ICD-10-CM | POA: Diagnosis not present

## 2021-05-16 DIAGNOSIS — E0500 Thyrotoxicosis with diffuse goiter without thyrotoxic crisis or storm: Secondary | ICD-10-CM | POA: Diagnosis not present

## 2021-05-16 DIAGNOSIS — E559 Vitamin D deficiency, unspecified: Secondary | ICD-10-CM | POA: Diagnosis not present

## 2021-05-21 DIAGNOSIS — Z6836 Body mass index (BMI) 36.0-36.9, adult: Secondary | ICD-10-CM | POA: Diagnosis not present

## 2021-05-21 DIAGNOSIS — E0500 Thyrotoxicosis with diffuse goiter without thyrotoxic crisis or storm: Secondary | ICD-10-CM | POA: Diagnosis not present

## 2021-05-21 DIAGNOSIS — E559 Vitamin D deficiency, unspecified: Secondary | ICD-10-CM | POA: Diagnosis not present

## 2021-09-06 DIAGNOSIS — E663 Overweight: Secondary | ICD-10-CM | POA: Diagnosis not present

## 2021-09-06 DIAGNOSIS — F410 Panic disorder [episodic paroxysmal anxiety] without agoraphobia: Secondary | ICD-10-CM | POA: Diagnosis not present

## 2021-09-06 DIAGNOSIS — I711 Thoracic aortic aneurysm, ruptured, unspecified: Secondary | ICD-10-CM | POA: Diagnosis not present

## 2021-09-06 DIAGNOSIS — F411 Generalized anxiety disorder: Secondary | ICD-10-CM | POA: Diagnosis not present

## 2021-09-06 DIAGNOSIS — E0500 Thyrotoxicosis with diffuse goiter without thyrotoxic crisis or storm: Secondary | ICD-10-CM | POA: Diagnosis not present

## 2021-09-06 DIAGNOSIS — L259 Unspecified contact dermatitis, unspecified cause: Secondary | ICD-10-CM | POA: Diagnosis not present

## 2021-09-06 DIAGNOSIS — E063 Autoimmune thyroiditis: Secondary | ICD-10-CM | POA: Diagnosis not present

## 2021-09-17 DIAGNOSIS — F410 Panic disorder [episodic paroxysmal anxiety] without agoraphobia: Secondary | ICD-10-CM | POA: Diagnosis not present

## 2021-09-17 DIAGNOSIS — E063 Autoimmune thyroiditis: Secondary | ICD-10-CM | POA: Diagnosis not present

## 2021-09-17 DIAGNOSIS — F411 Generalized anxiety disorder: Secondary | ICD-10-CM | POA: Diagnosis not present

## 2021-09-17 DIAGNOSIS — E0500 Thyrotoxicosis with diffuse goiter without thyrotoxic crisis or storm: Secondary | ICD-10-CM | POA: Diagnosis not present

## 2021-11-19 DIAGNOSIS — E0500 Thyrotoxicosis with diffuse goiter without thyrotoxic crisis or storm: Secondary | ICD-10-CM | POA: Diagnosis not present

## 2021-11-19 DIAGNOSIS — E559 Vitamin D deficiency, unspecified: Secondary | ICD-10-CM | POA: Diagnosis not present

## 2021-11-20 DIAGNOSIS — E559 Vitamin D deficiency, unspecified: Secondary | ICD-10-CM | POA: Diagnosis not present

## 2021-11-20 DIAGNOSIS — Z6836 Body mass index (BMI) 36.0-36.9, adult: Secondary | ICD-10-CM | POA: Diagnosis not present

## 2021-11-20 DIAGNOSIS — E0500 Thyrotoxicosis with diffuse goiter without thyrotoxic crisis or storm: Secondary | ICD-10-CM | POA: Diagnosis not present

## 2022-04-30 DIAGNOSIS — F410 Panic disorder [episodic paroxysmal anxiety] without agoraphobia: Secondary | ICD-10-CM | POA: Diagnosis not present

## 2022-04-30 DIAGNOSIS — F411 Generalized anxiety disorder: Secondary | ICD-10-CM | POA: Diagnosis not present

## 2022-04-30 DIAGNOSIS — E0500 Thyrotoxicosis with diffuse goiter without thyrotoxic crisis or storm: Secondary | ICD-10-CM | POA: Diagnosis not present

## 2022-04-30 DIAGNOSIS — E063 Autoimmune thyroiditis: Secondary | ICD-10-CM | POA: Diagnosis not present

## 2022-05-08 DIAGNOSIS — Z01419 Encounter for gynecological examination (general) (routine) without abnormal findings: Secondary | ICD-10-CM | POA: Diagnosis not present

## 2022-11-07 DIAGNOSIS — R002 Palpitations: Secondary | ICD-10-CM | POA: Diagnosis not present

## 2022-11-14 ENCOUNTER — Telehealth: Payer: Self-pay | Admitting: Cardiology

## 2022-11-14 DIAGNOSIS — N390 Urinary tract infection, site not specified: Secondary | ICD-10-CM

## 2022-11-14 DIAGNOSIS — M7918 Myalgia, other site: Secondary | ICD-10-CM

## 2022-11-14 DIAGNOSIS — D649 Anemia, unspecified: Secondary | ICD-10-CM | POA: Insufficient documentation

## 2022-11-14 HISTORY — DX: Urinary tract infection, site not specified: N39.0

## 2022-11-14 HISTORY — DX: Anemia, unspecified: D64.9

## 2022-11-14 HISTORY — DX: Myalgia, other site: M79.18

## 2022-11-14 NOTE — Progress Notes (Signed)
04/03/2020 Cardiology Office Note:    Date:  11/15/2022   ID:  Krista Long, DOB [birth year], MRN [id number]  PCP:  Gordy Councilman, FNP  Cardiologist:  Norman Herrlich, MD     Referring MD: Gordy Councilman, FNP    ASSESSMENT:    1. Chest pain of uncertain etiology   2. PVC's (premature ventricular contractions)    PLAN:    In order of problems listed above:  She presents with noncardiac chest pain.  It is pleuritic in nature not consistent with pericarditis with normal EKG and no risk factors for DVT or pulmonary thromboembolism she is quite tender over the costochondral junction reproducing her complaint and what she has is costochondritis.  I will place her on prescription strength Celebrex 10 days with PPI coverage for GI prophylaxis.  I think she should get a chest x-ray and I asked her to go to [facility] family practice or PCP and have it performed there and I will draw labs including inflammatory markers sed rate CRP and a D-dimer to exclude idiopathic pericarditis and a higher degree of certainty we will need to pursue pulmonary embolism.  I told her I expect her to probably recover as she is not better to send a MyChart message. Improved no PVCs on her EKG today monitor is pending through her primary care physician BP is low but she shows no signs of hypoperfusion.   Next appointment: as needed   Medication Adjustments/Labs and Tests Ordered: Current medicines are reviewed at length with the patient today.  Concerns regarding medicines are outlined above.  No orders of the defined types were placed in this encounter.  No orders of the defined types were placed in this encounter.   Chief Complaint  Patient presents with   Chest Pain    3    History of Present Illness:    Krista Long is a 37 y.o. female with a hx of symptomatic frequent PVCs with flecainide therapy hypothyroidism with Graves' disease in remission last seen 04/03/2020.  She is known to our office  yesterday with complaints of chest pain for 2 weeks centered over the left shoulder.  Following that visit she had an echocardiogram performed 04/21/2020 with a structurally normal heart.  Her event monitor reported 04/21/2020 she had frequent symptomatic PVCs with a burden of 22%.  Compliance with diet, lifestyle and medications: Yes  Last few weeks she has been having chest It is nonexertional localized to the left anterior chest pressure but more sharp and pressure clearly pleuritic not positional and she is a little breathless just because of the pain when she takes a deep breath she is also felt fatigued she has had no fever or chills no risk factors for DVT or pulmonary embolism. They are in the process of building a home and she has been doing lifting. She has no history of the chest wall injury. She wore a monitor that was removed yesterday pending results and had labs done at her PCP office Past Medical History:  Diagnosis Date   Anxiety 11/20/2017   Chest pain 08/12/2016   Chronic autoimmune thyroiditis 10/14/2017   Depression 11/20/2017   Endometritis following delivery 12/28/2013   Frequent PVCs 08/12/2016   Graves disease    Graves' disease in remission 10/14/2017   Hashimoto's disease    History of cesarean section 11/20/2017   Hyperthyroidism    Indication for care in labor or delivery 12/22/2013   Myometritis 12/29/2013   Obesity (BMI  30-39.9) 07/26/2015   Personal history of hyperthyroidism 10/14/2017   Postoperative state 12/23/2013   Vitamin D deficiency 10/14/2017    Past Surgical History:  Procedure Laterality Date   CESAREAN SECTION     CESAREAN SECTION N/A 12/23/2013   Procedure: CESAREAN SECTION;  Surgeon: Loney Laurence, MD;  Location: [facility];  Service: Obstetrics;  Laterality: N/A;   CHOLECYSTECTOMY     DILATION AND CURETTAGE OF UTERUS     PVC ABLATION N/A 09/15/2020   Procedure: PVC ABLATION;  Surgeon: Regan Lemming, MD;  Location: [facility];  Service:  Cardiovascular;  Laterality: N/A;    Current Medications: Current Meds  Medication Sig   ALPRAZolam (XANAX) 1 MG tablet Take 1 mg by mouth at bedtime as needed for sleep.   desogestrel-ethinyl estradiol (APRI) 0.15-30 MG-MCG tablet Take 1 tablet by mouth daily.   escitalopram (LEXAPRO) 10 MG tablet Take 10 mg by mouth daily.   [DISCONTINUED] venlafaxine XR (EFFEXOR-XR) 150 MG 24 hr capsule Take 150 mg by mouth daily with breakfast.     Allergies:   Cefdinir, Methimazole, Scopolamine, and Tambocor [flecainide]   Social History   Socioeconomic History   Marital status: Married    Spouse name: Not on file   Number of children: Not on file   Years of education: Not on file   Highest education level: Not on file  Occupational History   Not on file  Tobacco Use   Smoking status: Never   Smokeless tobacco: Never  Vaping Use   Vaping Use: Never used  Substance and Sexual Activity   Alcohol use: No   Drug use: No   Sexual activity: Yes    Birth control/protection: None  Other Topics Concern   Not on file  Social History Narrative   Not on file   Social Determinants of Health   Financial Resource Strain: Not on file  Food Insecurity: Not on file  Transportation Needs: Not on file  Physical Activity: Not on file  Stress: Not on file  Social Connections: Not on file     Family History: The patient's sinus rhythm normal no signs of pericarditis or cardiac ischemia family history includes Alzheimer's disease in her paternal grandmother; Hyperlipidemia in her mother. There is no history of Heart disease or Diabetes. ROS:   Please see the history of present illness.    All other systems reviewed and are negative.  EKGs/Labs/Other Studies Reviewed:    The following studies were reviewed today:  Cardiac Studies & Procedures       ECHOCARDIOGRAM  ECHOCARDIOGRAM COMPLETE 04/21/2020  Narrative ECHOCARDIOGRAM REPORT    Patient Name:   Krista Long Date of Exam:  04/21/2020 Medical Rec #:  [id number]        Height:       59.0 in Accession #:    [id number]       Weight:       163.4 lb Date of Birth:  15-Jan-1986        BSA:          1.693 m Patient Age:    34 years         BP:           118/75 mmHg Patient Gender: F                HR:           92 bpm. Exam Location:  [place]  Procedure: 2D Echo  Indications:  Frequent PVCs [I49.3 (ICD-10-CM)]; Graves' disease in remission [E05.00 (ICD-10-CM)]  History:        Patient has no prior history of Echocardiogram examinations. Signs/Symptoms:Chest Pain.  Sonographer:    Louie Boston Referring Phys: [phone] [place]on Edgin J Raeven Pint  IMPRESSIONS   1. Left ventricular ejection fraction, by estimation, is 60 to 65%. The left ventricle has normal function. The left ventricle has no regional wall motion abnormalities. Left ventricular diastolic parameters were normal. 2. Right ventricular systolic function is normal. The right ventricular size is normal. There is normal pulmonary artery systolic pressure. 3. The mitral valve is normal in structure. No evidence of mitral valve regurgitation. No evidence of mitral stenosis. 4. The aortic valve is normal in structure. Aortic valve regurgitation is not visualized. No aortic stenosis is present. 5. The inferior vena cava is normal in size with greater than 50% respiratory variability, suggesting right atrial pressure of 3 mmHg.  FINDINGS Left Ventricle: Left ventricular ejection fraction, by estimation, is 60 to 65%. The left ventricle has normal function. The left ventricle has no regional wall motion abnormalities. The left ventricular internal cavity size was normal in size. There is no left ventricular hypertrophy. Left ventricular diastolic parameters were normal.  Right Ventricle: The right ventricular size is normal. No increase in right ventricular wall thickness. Right ventricular systolic function is normal. There is normal pulmonary artery systolic pressure. The  tricuspid regurgitant velocity is 1.89 m/s, and with an assumed right atrial pressure of 3 mmHg, the estimated right ventricular systolic pressure is 17.3 mmHg.  Left Atrium: Left atrial size was normal in size.  Right Atrium: Right atrial size was normal in size.  Pericardium: There is no evidence of pericardial effusion.  Mitral Valve: The mitral valve is normal in structure. No evidence of mitral valve regurgitation. No evidence of mitral valve stenosis.  Tricuspid Valve: The tricuspid valve is normal in structure. Tricuspid valve regurgitation is not demonstrated. No evidence of tricuspid stenosis.  Aortic Valve: The aortic valve is normal in structure. Aortic valve regurgitation is not visualized. No aortic stenosis is present.  Pulmonic Valve: The pulmonic valve was normal in structure. Pulmonic valve regurgitation is not visualized. No evidence of pulmonic stenosis.  Aorta: The aortic root is normal in size and structure.  Venous: The inferior vena cava is normal in size with greater than 50% respiratory variability, suggesting right atrial pressure of 3 mmHg.  IAS/Shunts: No atrial level shunt detected by color flow Doppler.   LEFT VENTRICLE PLAX 2D LVIDd:         4.30 cm  Diastology LVIDs:         2.50 cm  LV e' medial:    7.51 cm/s LV PW:         1.00 cm  LV E/e' medial:  10.9 LV IVS:        0.90 cm  LV e' lateral:   9.68 cm/s LVOT diam:     2.00 cm  LV E/e' lateral: 8.5 LV SV:         46 LV SV Index:   27 LVOT Area:     3.14 cm   RIGHT VENTRICLE             IVC RV S prime:     10.40 cm/s  IVC diam: 1.60 cm TAPSE (M-mode): 1.9 cm  LEFT ATRIUM             Index       RIGHT ATRIUM  Index LA diam:        3.10 cm 1.83 cm/m  RA Area:     10.30 cm LA Vol (A2C):   30.0 ml 17.72 ml/m RA Volume:   21.10 ml  12.47 ml/m LA Vol (A4C):   25.9 ml 15.30 ml/m LA Biplane Vol: 28.0 ml 16.54 ml/m AORTIC VALVE LVOT Vmax:   68.75 cm/s LVOT Vmean:  44.650 cm/s LVOT  VTI:    0.147 m  AORTA Ao Root diam: 2.60 cm  MITRAL VALVE               TRICUSPID VALVE MV Area (PHT): 3.68 cm    TR Peak grad:   14.3 mmHg MV Decel Time: 206 msec    TR Vmax:        189.00 cm/s MV E velocity: 82.00 cm/s MV A velocity: 62.60 cm/s  SHUNTS MV E/A ratio:  1.31        Systemic VTI:  0.15 m Systemic Diam: 2.00 cm  Gypsy Balsam MD Electronically signed by Gypsy Balsam MD Signature Date/Time: 04/21/2020/12:32:55 PM    Final    MONITORS  LONG TERM MONITOR (3-14 DAYS) 04/21/2020  Narrative A ZIO monitor was performed for 3 days and 9 hours assess frequent PVCs.  The rhythm throughout was sinus with average minimum maximum rates of 99, 69 to 141 bpm.  There were no pauses of 3 seconds or greater and no episodes of AV nodal block or sinus node exit block.  There is no supraventricular ectopy.  Ventricular ectopy was frequent with a 22.1% burden of PVCs and 1 couplet.  Longest episode of bigeminy 8 minutes 14 seconds.  There were 10 triggered and 8 diary events associated with frequent PVCs and predominantly bigeminy.   Conclusion frequent and symptomatic PVCs 22.1% burden.           EKG:  EKG ordered today and personally reviewed.  The ekg ordered today dem sinus rhythm normal no signs of pericarditis or cardiac ischemia.    Physical Exam:    VS:  BP (!) 88/60 (BP Location: Left Arm, Patient Position: Sitting)   Pulse 84   Ht 4\' 11"  (1.499 m)   Wt 173 lb (78.5 kg)   SpO2 96%   BMI 34.94 kg/m     Wt Readings from Last 3 Encounters:  11/15/22 173 lb (78.5 kg)  01/29/21 183 lb (83 kg)  10/16/20 181 lb 9.6 oz (82.4 kg)     GEN: No acute distress she is not breathless well nourished, well developed in no acute distress HEENT: Normal NECK: No JVD; No carotid bruits LYMPHATICS: No lymphadenopathy CARDIAC: She has no pericardial or pleural rub RRR, no murmurs, rubs, gallops RESPIRATORY:  Clear to auscultation without rales, wheezing or rhonchi   ABDOMEN: Soft, non-tender, non-distended MUSCULOSKELETAL:  No edema; No deformity  SKIN: Warm and dry NEUROLOGIC:  Alert and oriented x 3 PSYCHIATRIC:  Normal affect    Signed, Norman Herrlich, MD  11/15/2022 2:05 PM    [facility]

## 2022-11-14 NOTE — Telephone Encounter (Signed)
Pt c/o of Chest Pain: STAT if CP now or developed within 24 hours  1. Are you having CP right now? Yes  2. Are you experiencing any other symptoms (ex. SOB, nausea, vomiting, sweating)? SOB  3. How long have you been experiencing CP? 2 weeks  4. Is your CP continuous or coming and going? Come and going, more frequent now  5. Have you taken Nitroglycerin? no ?  Call transferred

## 2022-11-14 NOTE — Telephone Encounter (Signed)
Pt called complaining of chest pain, shoulder blade pain over the last 2 weeks. She saw her PCP and she wore a monitor a week. She mailed it in 11-14-22. She has taken Tylenol for the pain. Encouraged pt if the pain is worsening she should go to the ED for evaluation. She has an appt with Dr. Dulce Sellar at 1:20 on 11-15-22 if she does not go to the ED. Pt agreed and verbalized understanding.

## 2022-11-15 ENCOUNTER — Ambulatory Visit: Payer: BC Managed Care – PPO | Attending: Cardiology | Admitting: Cardiology

## 2022-11-15 ENCOUNTER — Encounter: Payer: Self-pay | Admitting: Cardiology

## 2022-11-15 VITALS — BP 88/60 | HR 84 | Ht 59.0 in | Wt 173.0 lb

## 2022-11-15 DIAGNOSIS — R079 Chest pain, unspecified: Secondary | ICD-10-CM

## 2022-11-15 DIAGNOSIS — I493 Ventricular premature depolarization: Secondary | ICD-10-CM

## 2022-11-15 MED ORDER — CELECOXIB 100 MG PO CAPS: 100.0000 mg | ORAL_CAPSULE | Freq: Two times a day (BID) | ORAL | 0 refills | Status: DC

## 2022-11-15 MED ORDER — PANTOPRAZOLE SODIUM 40 MG PO TBEC: 40.0000 mg | DELAYED_RELEASE_TABLET | Freq: Every day | ORAL | 0 refills | Status: DC

## 2022-11-15 NOTE — Patient Instructions (Signed)
Medication Instructions:  Your physician has recommended you make the following change in your medication:   START: Celebrex 100 mg twice daily for 10 days START: Protonix 40 mg daily for 2 weeks  *If you need a refill on your cardiac medications before your next appointment, please call your pharmacy*   Lab Work: Your physician recommends that you return for lab work in:   Labs today: Sed Rate, CRP, D-dimer  If you have labs (blood work) drawn today and your tests are completely normal, you will receive your results only by: [employer] (if you have MyChart) OR A paper copy in the mail If you have any lab test that is abnormal or we need to change your treatment, we will call you to review the results.   Testing/Procedures: chest X-ray    Follow-Up: At [facility], you and your health needs are our priority.  As part of our continuing mission to provide you with exceptional heart care, we have created designated Provider Care Teams.  These Care Teams include your primary Cardiologist (physician) and Advanced Practice Providers (APPs -  Physician Assistants and Nurse Practitioners) who all work together to provide you with the care you need, when you need it.  We recommend signing up for the patient portal called "MyChart".  Sign up information is provided on this After Visit Summary.  MyChart is used to connect with patients for Virtual Visits (Telemedicine).  Patients are able to view lab/test results, encounter notes, upcoming appointments, etc.  Non-urgent messages can be sent to your provider as well.   To learn more about what you can do with MyChart, go to [email or web address].    Your next appointment:   Follow up as needed  Provider:   Norman Herrlich, MD    Other Instructions None

## 2022-11-16 LAB — D-DIMER, QUANTITATIVE: D-DIMER: 0.77 mg/L FEU — ABNORMAL HIGH (ref 0.00–0.49)

## 2022-11-16 LAB — C-REACTIVE PROTEIN: CRP: 4 mg/L (ref 0–10)

## 2022-11-16 LAB — SEDIMENTATION RATE: Sed Rate: 12 mm/hr (ref 0–32)

## 2022-11-18 ENCOUNTER — Other Ambulatory Visit: Payer: Self-pay

## 2022-11-18 ENCOUNTER — Encounter: Payer: Self-pay | Admitting: Cardiology

## 2022-11-18 ENCOUNTER — Telehealth: Payer: Self-pay | Admitting: Cardiology

## 2022-11-18 DIAGNOSIS — R079 Chest pain, unspecified: Secondary | ICD-10-CM

## 2022-11-18 DIAGNOSIS — R0602 Shortness of breath: Secondary | ICD-10-CM

## 2022-11-18 NOTE — Telephone Encounter (Signed)
Folloe Up:      Patient is returning Richard's call from this morning.

## 2022-11-19 DIAGNOSIS — R0602 Shortness of breath: Secondary | ICD-10-CM | POA: Diagnosis not present

## 2022-11-19 DIAGNOSIS — M79661 Pain in right lower leg: Secondary | ICD-10-CM | POA: Diagnosis not present

## 2022-11-19 DIAGNOSIS — Z86711 Personal history of pulmonary embolism: Secondary | ICD-10-CM | POA: Diagnosis not present

## 2022-11-19 DIAGNOSIS — R791 Abnormal coagulation profile: Secondary | ICD-10-CM | POA: Diagnosis not present

## 2022-11-19 NOTE — Telephone Encounter (Signed)
Patient informed of results.  

## 2022-11-22 ENCOUNTER — Ambulatory Visit (HOSPITAL_BASED_OUTPATIENT_CLINIC_OR_DEPARTMENT_OTHER)
Admission: RE | Admit: 2022-11-22 | Discharge: 2022-11-22 | Disposition: A | Discharge status: Home / Self Care | Payer: BC Managed Care – PPO | Source: Ambulatory Visit | Attending: Cardiology | Admitting: Cardiology

## 2022-11-22 ENCOUNTER — Telehealth: Payer: Self-pay | Admitting: Cardiology

## 2022-11-22 ENCOUNTER — Encounter (HOSPITAL_BASED_OUTPATIENT_CLINIC_OR_DEPARTMENT_OTHER): Payer: Self-pay

## 2022-11-22 DIAGNOSIS — R0602 Shortness of breath: Secondary | ICD-10-CM | POA: Diagnosis not present

## 2022-11-22 DIAGNOSIS — R7989 Other specified abnormal findings of blood chemistry: Secondary | ICD-10-CM | POA: Diagnosis not present

## 2022-11-22 MED ORDER — IOHEXOL 350 MG/ML SOLN
100.0000 mL | Freq: Once | INTRAVENOUS | Status: AC | PRN
  Administered 2022-11-22: 75 mL via INTRAVENOUS

## 2022-11-22 NOTE — Telephone Encounter (Signed)
Spoke with patient and let her know that we have not received the xray as of yet.  She told me she would call and have them send it.  I told her I would also request it from them as well since Dr. Dulce Sellar ordered it and they done it in there office. She thanked me for the call and had no additional questions.

## 2022-11-22 NOTE — Telephone Encounter (Signed)
Patient states she had a chest xray with [facility] on Tuesday and they were supposed to send the results. She would like to confirm it has been received. Please advise.

## 2022-12-05 DIAGNOSIS — H00022 Hordeolum internum right lower eyelid: Secondary | ICD-10-CM | POA: Diagnosis not present

## 2022-12-23 ENCOUNTER — Encounter: Payer: Self-pay | Admitting: Cardiology

## 2022-12-25 ENCOUNTER — Telehealth: Payer: Self-pay | Admitting: Cardiology

## 2022-12-25 ENCOUNTER — Telehealth: Payer: Self-pay

## 2022-12-25 ENCOUNTER — Other Ambulatory Visit: Payer: Self-pay | Admitting: Cardiology

## 2022-12-25 MED ORDER — CELECOXIB 100 MG PO CAPS: 100.0000 mg | ORAL_CAPSULE | Freq: Two times a day (BID) | ORAL | 0 refills | Status: DC

## 2022-12-25 NOTE — Addendum Note (Signed)
Addended by: Baldo Ash D on: 12/25/2022 11:02 AM   Modules accepted: Orders

## 2022-12-25 NOTE — Telephone Encounter (Signed)
Pt called wanting a refill on Celebrex. Dr. Dulce Sellar had given it to her for Costochondritis for 10 days. She went to the Chiropractor and the inflammation has flared up again and she is wanting a refill on the Celebrex 100mg  Bid.

## 2022-12-25 NOTE — Telephone Encounter (Signed)
Per Dr. Tomie China, Celebrex 100mg  Bid x 10 x 1 more refill. Sent to pharmacy.

## 2022-12-25 NOTE — Telephone Encounter (Signed)
*  STAT* If patient is at the pharmacy, call can be transferred to refill team.   1. Which medications need to be refilled? (please list name of each medication and dose if known)   celecoxib (CELEBREX) 100 MG capsule    2. Which pharmacy/location (including street and city if local pharmacy) is medication to be sent to?   [facility] - [place], [place] - [address]    3. Do they need a 30 day or 90 day supply? 2 weeks

## 2023-01-31 ENCOUNTER — Encounter: Payer: Self-pay | Admitting: Cardiology

## 2023-02-03 ENCOUNTER — Other Ambulatory Visit: Payer: Self-pay | Admitting: *Deleted

## 2023-02-03 MED ORDER — CELECOXIB 100 MG PO CAPS: 100.0000 mg | ORAL_CAPSULE | Freq: Two times a day (BID) | ORAL | 1 refills | Status: DC

## 2023-02-07 ENCOUNTER — Telehealth: Payer: Self-pay | Admitting: Cardiology

## 2023-02-07 NOTE — Telephone Encounter (Signed)
Patient called to follow-up on next steps since starting on new medication.

## 2023-02-07 NOTE — Telephone Encounter (Signed)
Spoke with Dr. Dulce Sellar regarding the message below:  "Spoke with pt, she has caught a cold and has a bad cough and so the symptoms are now worse because of the cough. Aware she was to have a special kind of PT,lontophoresis, we just have to find out who in the area does that."  Dr. Dulce Sellar recommended that the patient not do the PT and Iontophoresis until she has gotten over her cold. He asked that she calls [place] back when she is over her cold and then we can schedule the PT and Iontophoresis. I relayed this information to the patient and she was agreeable with this plan.

## 2023-02-07 NOTE — Telephone Encounter (Signed)
Spoke with pt, she has caught a cold and has a bad cough and so the symptoms are now worse because of the cough. Aware she was to have a special kind of PT,lontophoresis, we just have to find out who in the area does that.

## 2023-03-04 DIAGNOSIS — E063 Autoimmune thyroiditis: Secondary | ICD-10-CM | POA: Diagnosis not present

## 2023-03-04 DIAGNOSIS — F411 Generalized anxiety disorder: Secondary | ICD-10-CM | POA: Diagnosis not present

## 2023-03-04 DIAGNOSIS — E0500 Thyrotoxicosis with diffuse goiter without thyrotoxic crisis or storm: Secondary | ICD-10-CM | POA: Diagnosis not present

## 2023-03-04 DIAGNOSIS — F410 Panic disorder [episodic paroxysmal anxiety] without agoraphobia: Secondary | ICD-10-CM | POA: Diagnosis not present

## 2023-03-10 DIAGNOSIS — F411 Generalized anxiety disorder: Secondary | ICD-10-CM | POA: Diagnosis not present

## 2023-03-10 DIAGNOSIS — F322 Major depressive disorder, single episode, severe without psychotic features: Secondary | ICD-10-CM | POA: Diagnosis not present

## 2023-03-10 DIAGNOSIS — R45851 Suicidal ideations: Secondary | ICD-10-CM | POA: Diagnosis not present

## 2023-03-17 DIAGNOSIS — F411 Generalized anxiety disorder: Secondary | ICD-10-CM | POA: Diagnosis not present

## 2023-03-17 DIAGNOSIS — F322 Major depressive disorder, single episode, severe without psychotic features: Secondary | ICD-10-CM | POA: Diagnosis not present

## 2023-03-17 DIAGNOSIS — R45851 Suicidal ideations: Secondary | ICD-10-CM | POA: Diagnosis not present

## 2023-03-27 DIAGNOSIS — R45851 Suicidal ideations: Secondary | ICD-10-CM | POA: Diagnosis not present

## 2023-03-27 DIAGNOSIS — F322 Major depressive disorder, single episode, severe without psychotic features: Secondary | ICD-10-CM | POA: Diagnosis not present

## 2023-03-27 DIAGNOSIS — F411 Generalized anxiety disorder: Secondary | ICD-10-CM | POA: Diagnosis not present

## 2023-04-24 DIAGNOSIS — F411 Generalized anxiety disorder: Secondary | ICD-10-CM | POA: Diagnosis not present

## 2023-04-24 DIAGNOSIS — R45851 Suicidal ideations: Secondary | ICD-10-CM | POA: Diagnosis not present

## 2023-04-24 DIAGNOSIS — F322 Major depressive disorder, single episode, severe without psychotic features: Secondary | ICD-10-CM | POA: Diagnosis not present

## 2023-05-22 DIAGNOSIS — F411 Generalized anxiety disorder: Secondary | ICD-10-CM | POA: Diagnosis not present

## 2023-05-22 DIAGNOSIS — F322 Major depressive disorder, single episode, severe without psychotic features: Secondary | ICD-10-CM | POA: Diagnosis not present

## 2023-05-22 DIAGNOSIS — R45851 Suicidal ideations: Secondary | ICD-10-CM | POA: Diagnosis not present

## 2023-07-29 DIAGNOSIS — Z01419 Encounter for gynecological examination (general) (routine) without abnormal findings: Secondary | ICD-10-CM | POA: Diagnosis not present

## 2023-07-29 DIAGNOSIS — Z124 Encounter for screening for malignant neoplasm of cervix: Secondary | ICD-10-CM | POA: Diagnosis not present

## 2023-08-04 DIAGNOSIS — F322 Major depressive disorder, single episode, severe without psychotic features: Secondary | ICD-10-CM | POA: Diagnosis not present

## 2023-08-04 DIAGNOSIS — R45851 Suicidal ideations: Secondary | ICD-10-CM | POA: Diagnosis not present

## 2023-08-04 DIAGNOSIS — F411 Generalized anxiety disorder: Secondary | ICD-10-CM | POA: Diagnosis not present

## 2023-08-27 ENCOUNTER — Encounter: Payer: Self-pay | Admitting: Cardiology

## 2023-08-27 ENCOUNTER — Ambulatory Visit: Attending: Cardiology | Admitting: Cardiology

## 2023-08-27 VITALS — BP 104/78 | HR 79 | Ht 59.0 in | Wt 171.2 lb

## 2023-08-27 DIAGNOSIS — I493 Ventricular premature depolarization: Secondary | ICD-10-CM

## 2023-08-27 DIAGNOSIS — R079 Chest pain, unspecified: Secondary | ICD-10-CM | POA: Diagnosis not present

## 2023-08-27 MED ORDER — CELECOXIB 100 MG PO CAPS: 100.0000 mg | ORAL_CAPSULE | Freq: Two times a day (BID) | ORAL | 0 refills | Status: DC

## 2023-08-27 NOTE — Patient Instructions (Signed)
 Medication Instructions:  Your physician has recommended you make the following change in your medication:   START: Celebrex 100 mg two times daily  *If you need a refill on your cardiac medications before your next appointment, please call your pharmacy*   Lab Work: None If you have labs (blood work) drawn today and your tests are completely normal, you will receive your results only by: MyChart Message (if you have MyChart) OR A paper copy in the mail If you have any lab test that is abnormal or we need to change your treatment, we will call you to review the results.   Testing/Procedures: None   Follow-Up: At [facility], you and your health needs are our priority.  As part of our continuing mission to provide you with exceptional heart care, we have created designated Provider Care Teams.  These Care Teams include your primary Cardiologist (physician) and Advanced Practice Providers (APPs -  Physician Assistants and Nurse Practitioners) who all work together to provide you with the care you need, when you need it.  We recommend signing up for the patient portal called "MyChart".  Sign up information is provided on this After Visit Summary.  MyChart is used to connect with patients for Virtual Visits (Telemedicine).  Patients are able to view lab/test results, encounter notes, upcoming appointments, etc.  Non-urgent messages can be sent to your provider as well.   To learn more about what you can do with MyChart, go to [email or web address].    Your next appointment:   6 week(s)  Provider:   Norman Herrlich, MD    Other Instructions None

## 2023-08-27 NOTE — Progress Notes (Signed)
 Cardiology Office Note:    Date:  08/27/2023   ID:  Krista Long, DOB [birth year], MRN [id number]  PCP:  Street, Stephanie Coup, MD  Cardiologist:  Norman Herrlich, MD    Referring MD: [id number], Stephanie Coup, *    ASSESSMENT:    1. Chest pain of uncertain etiology   2. PVC's (premature ventricular contractions)    PLAN:    In order of problems listed above:  Unfortunately has developed chronic costochondral pain syndrome very typical repeat trial of Celebrex if ineffective referral to physical therapy Improved but still has occasional PVCs at the same RV outflow tract morphology.   Next appointment: 6 weeks   Medication Adjustments/Labs and Tests Ordered: Current medicines are reviewed at length with the patient today.  Concerns regarding medicines are outlined above.  Orders Placed This Encounter  Procedures   EKG 12-Lead   Meds ordered this encounter  Medications   celecoxib (CELEBREX) 100 MG capsule    Sig: Take 1 capsule (100 mg total) by mouth 2 (two) times daily.    Dispense:  60 capsule    Refill:  0     History of Present Illness:    Krista Long is a 38 y.o. female with a hx of frequent symptomatic PVCs with PVC ablation April 2022 hypothyroidism with Graves' disease in remission and last seen for chest pain that was noncardiac in nature last seen 11/15/2022.  D-dimer is elevated she underwent chest CTA pulmonary embolism protocol which showed no findings of pulmonary embolism no pulmonary infiltrates pleural or pericardial effusion.  Compliance with diet, lifestyle and medications: Yes  She is continuing nonexertional chest pain physical exam and characteristics quite tender on palpation of costochondral pain syndrome which has become chronic Previously had relief with Celebrex We discussed referral to physical therapy it is difficult because of her home schooling prefers another trial of Celebrex and if unimproved will go to outpatient physical therapy  for iontophoresis high potency steroid to the CC J bilaterally She has occasional palpitation not severe or bothersome like it was prior to PVC ablation Curiously her EKG has a few PVCs of the same morphology RV outflow track Past Medical History:  Diagnosis Date   Anemia 11/14/2022   Anxiety 11/20/2017   Chest pain 08/12/2016   Chronic autoimmune thyroiditis 10/14/2017   Depression 11/20/2017   Endometritis following delivery 12/28/2013   Frequent PVCs 08/12/2016   Graves disease    Graves' disease in remission 10/14/2017   Hashimoto's disease    History of cesarean section 11/20/2017   Hyperthyroidism    Indication for care in labor or delivery 12/22/2013   Lower urinary tract infectious disease 11/14/2022   Musculoskeletal pain 11/14/2022   Myometritis 12/29/2013   Obesity (BMI 30-39.9) 07/26/2015   Personal history of hyperthyroidism 10/14/2017   Postoperative state 12/23/2013   PVC (premature ventricular contraction) 09/15/2020   Vitamin D deficiency 10/14/2017    Current Medications: Current Meds  Medication Sig   ALPRAZolam (XANAX) 1 MG tablet Take 1 mg by mouth at bedtime as needed for sleep.   celecoxib (CELEBREX) 100 MG capsule Take 1 capsule (100 mg total) by mouth 2 (two) times daily.   desogestrel-ethinyl estradiol (APRI) 0.15-30 MG-MCG tablet Take 1 tablet by mouth daily.   escitalopram (LEXAPRO) 10 MG tablet Take 10 mg by mouth daily.      EKGs/Labs/Other Studies Reviewed:    The following studies were reviewed today:  Cardiac Studies & Procedures   ______________________________________________________________________________________________  ECHOCARDIOGRAM  ECHOCARDIOGRAM COMPLETE 04/21/2020  Narrative ECHOCARDIOGRAM REPORT    Patient Name:   Krista Long Date of Exam: 04/21/2020 Medical Rec #:  [id number]        Height:       59.0 in Accession #:    [id number]       Weight:       163.4 lb Date of Birth:  September 05, 1985        BSA:           1.693 m Patient Age:    34 years         BP:           118/75 mmHg Patient Gender: F                HR:           92 bpm. Exam Location:  [facility]  Procedure: 2D Echo  Indications:    Frequent PVCs [I49.3 (ICD-10-CM)]; Graves' disease in remission [E05.00 (ICD-10-CM)]  History:        Patient has no prior history of Echocardiogram examinations. Signs/Symptoms:Chest Pain.  Sonographer:    Louie Boston Referring Phys: [id number] Krista Long  IMPRESSIONS   1. Left ventricular ejection fraction, by estimation, is 60 to 65%. The left ventricle has normal function. The left ventricle has no regional wall motion abnormalities. Left ventricular diastolic parameters were normal. 2. Right ventricular systolic function is normal. The right ventricular size is normal. There is normal pulmonary artery systolic pressure. 3. The mitral valve is normal in structure. No evidence of mitral valve regurgitation. No evidence of mitral stenosis. 4. The aortic valve is normal in structure. Aortic valve regurgitation is not visualized. No aortic stenosis is present. 5. The inferior vena cava is normal in size with greater than 50% respiratory variability, suggesting right atrial pressure of 3 mmHg.  FINDINGS Left Ventricle: Left ventricular ejection fraction, by estimation, is 60 to 65%. The left ventricle has normal function. The left ventricle has no regional wall motion abnormalities. The left ventricular internal cavity size was normal in size. There is no left ventricular hypertrophy. Left ventricular diastolic parameters were normal.  Right Ventricle: The right ventricular size is normal. No increase in right ventricular wall thickness. Right ventricular systolic function is normal. There is normal pulmonary artery systolic pressure. The tricuspid regurgitant velocity is 1.89 m/s, and with an assumed right atrial pressure of 3 mmHg, the estimated right ventricular systolic pressure is 17.3 mmHg.  Left  Atrium: Left atrial size was normal in size.  Right Atrium: Right atrial size was normal in size.  Pericardium: There is no evidence of pericardial effusion.  Mitral Valve: The mitral valve is normal in structure. No evidence of mitral valve regurgitation. No evidence of mitral valve stenosis.  Tricuspid Valve: The tricuspid valve is normal in structure. Tricuspid valve regurgitation is not demonstrated. No evidence of tricuspid stenosis.  Aortic Valve: The aortic valve is normal in structure. Aortic valve regurgitation is not visualized. No aortic stenosis is present.  Pulmonic Valve: The pulmonic valve was normal in structure. Pulmonic valve regurgitation is not visualized. No evidence of pulmonic stenosis.  Aorta: The aortic root is normal in size and structure.  Venous: The inferior vena cava is normal in size with greater than 50% respiratory variability, suggesting right atrial pressure of 3 mmHg.  IAS/Shunts: No atrial level shunt detected by color flow Doppler.   LEFT VENTRICLE PLAX 2D LVIDd:  4.30 cm  Diastology LVIDs:         2.50 cm  LV e' medial:    7.51 cm/s LV PW:         1.00 cm  LV E/e' medial:  10.9 LV IVS:        0.90 cm  LV e' lateral:   9.68 cm/s LVOT diam:     2.00 cm  LV E/e' lateral: 8.5 LV SV:         46 LV SV Index:   27 LVOT Area:     3.14 cm   RIGHT VENTRICLE             IVC RV S prime:     10.40 cm/s  IVC diam: 1.60 cm TAPSE (M-mode): 1.9 cm  LEFT ATRIUM             Index       RIGHT ATRIUM           Index LA diam:        3.10 cm 1.83 cm/m  RA Area:     10.30 cm LA Vol (A2C):   30.0 ml 17.72 ml/m RA Volume:   21.10 ml  12.47 ml/m LA Vol (A4C):   25.9 ml 15.30 ml/m LA Biplane Vol: 28.0 ml 16.54 ml/m AORTIC VALVE LVOT Vmax:   68.75 cm/s LVOT Vmean:  44.650 cm/s LVOT VTI:    0.147 m  AORTA Ao Root diam: 2.60 cm  MITRAL VALVE               TRICUSPID VALVE MV Area (PHT): 3.68 cm    TR Peak grad:   14.3 mmHg MV Decel Time: 206  msec    TR Vmax:        189.00 cm/s MV E velocity: 82.00 cm/s MV A velocity: 62.60 cm/s  SHUNTS MV E/A ratio:  1.31        Systemic VTI:  0.15 m Systemic Diam: 2.00 cm  Krista Balsam MD Electronically signed by Krista Balsam MD Signature Date/Time: 04/21/2020/12:32:55 PM    Final    MONITORS  LONG TERM MONITOR (3-14 DAYS) 04/17/2020  Narrative A ZIO monitor was performed for 3 days and 9 hours assess frequent PVCs.  The rhythm throughout was sinus with average minimum maximum rates of 99, 69 to 141 bpm.  There were no pauses of 3 seconds or greater and no episodes of AV nodal block or sinus node exit block.  There is no supraventricular ectopy.  Ventricular ectopy was frequent with a 22.1% burden of PVCs and 1 couplet.  Longest episode of bigeminy 8 minutes 14 seconds.  There were 10 triggered and 8 diary events associated with frequent PVCs and predominantly bigeminy.   Conclusion frequent and symptomatic PVCs 22.1% burden.       ______________________________________________________________________________________________      EKG Interpretation Date/Time:  Wednesday August 27 2023 13:34:00 EDT Ventricular Rate:  79 PR Interval:  126 QRS Duration:  76 QT Interval:  392 QTC Calculation: 449 R Axis:   44  Text Interpretation: Sinus rhythm with occasional Premature ventricular complexes When compared with ECG of 16-Sep-2020 06:20, Premature ventricular complexes are now Present Confirmed by Norman Herrlich ([id number]) on 08/27/2023 1:36:44 PM   Recent Labs: No results found for requested labs within last 365 days.  Recent Lipid Panel No results found for: "CHOL", "TRIG", "HDL", "CHOLHDL", "VLDL", "LDLCALC", "LDLDIRECT"  Physical Exam:    VS:  BP 104/78   Pulse 79  Ht 4\' 11"  (1.499 m)   Wt 171 lb 3.2 oz (77.7 kg)   SpO2 97%   BMI 34.58 kg/m     Wt Readings from Last 3 Encounters:  08/27/23 171 lb 3.2 oz (77.7 kg)  11/15/22 173 lb (78.5 kg)  01/29/21  183 lb (83 kg)    Quite tender costochondral junction bilaterally reproduces her complaint GEN:  Well nourished, well developed in no acute distress HEENT: Normal NECK: No JVD; No carotid bruits LYMPHATICS: No lymphadenopathy CARDIAC: RRR, no murmurs, rubs, gallops RESPIRATORY:  Clear to auscultation without rales, wheezing or rhonchi  ABDOMEN: Soft, non-tender, non-distended MUSCULOSKELETAL:  No edema; No deformity  SKIN: Warm and dry NEUROLOGIC:  Alert and oriented x 3 PSYCHIATRIC:  Normal affect    Signed, Norman Herrlich, MD  08/27/2023 2:12 PM    [facility]

## 2023-09-01 DIAGNOSIS — R45851 Suicidal ideations: Secondary | ICD-10-CM | POA: Diagnosis not present

## 2023-09-01 DIAGNOSIS — F322 Major depressive disorder, single episode, severe without psychotic features: Secondary | ICD-10-CM | POA: Diagnosis not present

## 2023-09-01 DIAGNOSIS — F411 Generalized anxiety disorder: Secondary | ICD-10-CM | POA: Diagnosis not present

## 2023-09-15 DIAGNOSIS — R45851 Suicidal ideations: Secondary | ICD-10-CM | POA: Diagnosis not present

## 2023-09-15 DIAGNOSIS — F411 Generalized anxiety disorder: Secondary | ICD-10-CM | POA: Diagnosis not present

## 2023-09-15 DIAGNOSIS — F322 Major depressive disorder, single episode, severe without psychotic features: Secondary | ICD-10-CM | POA: Diagnosis not present

## 2023-10-10 DIAGNOSIS — E063 Autoimmune thyroiditis: Secondary | ICD-10-CM | POA: Insufficient documentation

## 2023-10-13 DIAGNOSIS — F411 Generalized anxiety disorder: Secondary | ICD-10-CM | POA: Diagnosis not present

## 2023-10-13 DIAGNOSIS — F322 Major depressive disorder, single episode, severe without psychotic features: Secondary | ICD-10-CM | POA: Diagnosis not present

## 2023-10-16 ENCOUNTER — Ambulatory Visit: Attending: Cardiology | Admitting: Cardiology

## 2023-10-16 ENCOUNTER — Encounter: Payer: Self-pay | Admitting: Cardiology

## 2023-10-16 ENCOUNTER — Ambulatory Visit: Admitting: Cardiology

## 2023-10-16 VITALS — BP 118/74 | HR 82 | Ht 59.0 in | Wt 168.6 lb

## 2023-10-16 DIAGNOSIS — D649 Anemia, unspecified: Secondary | ICD-10-CM

## 2023-10-16 DIAGNOSIS — E669 Obesity, unspecified: Secondary | ICD-10-CM

## 2023-10-16 DIAGNOSIS — Z131 Encounter for screening for diabetes mellitus: Secondary | ICD-10-CM

## 2023-10-16 DIAGNOSIS — Z13220 Encounter for screening for lipoid disorders: Secondary | ICD-10-CM | POA: Diagnosis not present

## 2023-10-16 DIAGNOSIS — E559 Vitamin D deficiency, unspecified: Secondary | ICD-10-CM | POA: Diagnosis not present

## 2023-10-16 DIAGNOSIS — E0590 Thyrotoxicosis, unspecified without thyrotoxic crisis or storm: Secondary | ICD-10-CM

## 2023-10-16 DIAGNOSIS — E063 Autoimmune thyroiditis: Secondary | ICD-10-CM

## 2023-10-16 DIAGNOSIS — I493 Ventricular premature depolarization: Secondary | ICD-10-CM

## 2023-10-16 NOTE — Progress Notes (Signed)
 Cardiology Office Note:    Date:  10/16/2023   ID:  Krista Long, DOB 1985/09/02, MRN [id number]  PCP:  Street, Renford Cartwright, MD  Cardiologist:  Nelia Balzarine, MD   Referring MD: [address], Renford Cartwright, *    ASSESSMENT:    1. PVC's (premature ventricular contractions)   2. Obesity (BMI 30-39.9)    PLAN:    In order of problems listed above:  Primary prevention stressed with the patient.  Importance of compliance with diet medication stressed and patient verbalized standing. She has excellent exercise protocol and goes to the gym on a regular basis and I congratulated her about this.  She is asymptomatic from exercise. Frequent PVCs: These have settled.  She is asymptomatic.  EKG is unremarkable and discussed this with her. Obesity and history of vitamin D deficiency: Weight reduction stressed diet emphasized.  She will do blood work today.  She is fasting we will also check her lipids for restratification.  Risks of obesity explained. Patient will be seen in follow-up appointment in 12 months or earlier if the patient has any concerns.    Medication Adjustments/Labs and Tests Ordered: Current medicines are reviewed at length with the patient today.  Concerns regarding medicines are outlined above.  Orders Placed This Encounter  Procedures   EKG 12-Lead   No orders of the defined types were placed in this encounter.    No chief complaint on file.    History of Present Illness:    Krista Long is a 38 y.o. female.  Patient has past medical history of PVCs.  She is asymptomatic at this time and feels much better.  She goes to the gym on a regular basis.  She is trying to lose weight.  No chest pain orthopnea or PND.  At the time of my evaluation, the patient is alert awake oriented and in no distress.  Past Medical History:  Diagnosis Date   Anemia 11/14/2022   Anxiety 11/20/2017   Chest pain 08/12/2016   Chronic autoimmune thyroiditis 10/14/2017   Depression  11/20/2017   Endometritis following delivery 12/28/2013   Frequent PVCs 08/12/2016   Graves' disease in remission 10/14/2017   Hashimoto's disease    History of cesarean section 11/20/2017   Hyperthyroidism    Indication for care in labor or delivery 12/22/2013   Lower urinary tract infectious disease 11/14/2022   Musculoskeletal pain 11/14/2022   Myometritis 12/29/2013   Obesity (BMI 30-39.9) 07/26/2015   Personal history of hyperthyroidism 10/14/2017   Postoperative state 12/23/2013   PVC (premature ventricular contraction) 09/15/2020   Vitamin D deficiency 10/14/2017    Past Surgical History:  Procedure Laterality Date   CESAREAN SECTION     CESAREAN SECTION N/A 12/23/2013   Procedure: CESAREAN SECTION;  Surgeon: Oddis Bench, MD;  Location: [facility];  Service: Obstetrics;  Laterality: N/A;   CHOLECYSTECTOMY     DILATION AND CURETTAGE OF UTERUS     PVC ABLATION N/A 09/15/2020   Procedure: PVC ABLATION;  Surgeon: Lei Pump, MD;  Location: [facility];  Service: Cardiovascular;  Laterality: N/A;    Current Medications: Current Meds  Medication Sig   ALPRAZolam  (XANAX ) 1 MG tablet Take 1 mg by mouth at bedtime as needed for sleep.   buPROPion (WELLBUTRIN XL) 300 MG 24 hr tablet Take 300 mg by mouth daily.   desogestrel -ethinyl estradiol  (APRI ) 0.15-30 MG-MCG tablet Take 1 tablet by mouth daily.   escitalopram (LEXAPRO) 10 MG tablet Take  10 mg by mouth daily.     Allergies:   Cefdinir, Methimazole, Scopolamine , and Tambocor  [flecainide ]   Social History   Socioeconomic History   Marital status: Married    Spouse name: Not on file   Number of children: Not on file   Years of education: Not on file   Highest education level: Not on file  Occupational History   Not on file  Tobacco Use   Smoking status: Never   Smokeless tobacco: Never  Vaping Use   Vaping status: Never Used  Substance and Sexual Activity   Alcohol use: No   Drug use: No    Sexual activity: Yes    Birth control/protection: None  Other Topics Concern   Not on file  Social History Narrative   Not on file   Social Drivers of Health   Financial Resource Strain: Not on file  Food Insecurity: Not on file  Transportation Needs: Not on file  Physical Activity: Not on file  Stress: Not on file  Social Connections: Not on file     Family History: The patient's family history includes Alzheimer's disease in her paternal grandmother; Hyperlipidemia in her mother. There is no history of Heart disease or Diabetes.  ROS:   Please see the history of present illness.    All other systems reviewed and are negative.  EKGs/Labs/Other Studies Reviewed:    The following studies were reviewed today: .Aaron Aas   Aaron Aas.EKG Interpretation Date/Time:  Thursday Oct 16 2023 09:09:15 EDT Ventricular Rate:  82 PR Interval:  128 QRS Duration:  82 QT Interval:  380 QTC Calculation: 443 R Axis:   57  Text Interpretation: Normal sinus rhythm Normal ECG When compared with ECG of 27-Aug-2023 13:34, Premature ventricular complexes are no longer Present Confirmed by Hillis Lu [phone]) on 10/16/2023 9:34:23 AM      Recent Labs: No results found for requested labs within last 365 days.  Recent Lipid Panel No results found for: "CHOL", "TRIG", "HDL", "CHOLHDL", "VLDL", "LDLCALC", "LDLDIRECT"  Physical Exam:    VS:  BP 118/74   Pulse 82   Ht 4\' 11"  (1.499 m)   Wt 168 lb 9.6 oz (76.5 kg)   SpO2 96%   BMI 34.05 kg/m     Wt Readings from Last 3 Encounters:  10/16/23 168 lb 9.6 oz (76.5 kg)  08/27/23 171 lb 3.2 oz (77.7 kg)  11/15/22 173 lb (78.5 kg)     GEN: Patient is in no acute distress HEENT: Normal NECK: No JVD; No carotid bruits LYMPHATICS: No lymphadenopathy CARDIAC: Hear sounds regular, 2/6 systolic murmur at the apex. RESPIRATORY:  Clear to auscultation without rales, wheezing or rhonchi  ABDOMEN: Soft, non-tender, non-distended MUSCULOSKELETAL:  No edema; No  deformity  SKIN: Warm and dry NEUROLOGIC:  Alert and oriented x 3 PSYCHIATRIC:  Normal affect   Signed, Nelia Balzarine, MD  10/16/2023 9:33 AM    [facility]

## 2023-10-16 NOTE — Patient Instructions (Signed)
 Medication Instructions:  Your physician recommends that you continue on your current medications as directed. Please refer to the Current Medication list given to you today.  *If you need a refill on your cardiac medications before your next appointment, please call your pharmacy*   Lab Work: Your physician recommends that you have a CMP, CBC, TSH, vitamin D, A1C and lipids today in the office.  If you have labs (blood work) drawn today and your tests are completely normal, you will receive your results only by: MyChart Message (if you have MyChart) OR A paper copy in the mail If you have any lab test that is abnormal or we need to change your treatment, we will call you to review the results.   Testing/Procedures: None ordered   Follow-Up: At [facility], you and your health needs are our priority.  As part of our continuing mission to provide you with exceptional heart care, we have created designated Provider Care Teams.  These Care Teams include your primary Cardiologist (physician) and Advanced Practice Providers (APPs -  Physician Assistants and Nurse Practitioners) who all work together to provide you with the care you need, when you need it.  We recommend signing up for the patient portal called "MyChart".  Sign up information is provided on this After Visit Summary.  MyChart is used to connect with patients for Virtual Visits (Telemedicine).  Patients are able to view lab/test results, encounter notes, upcoming appointments, etc.  Non-urgent messages can be sent to your provider as well.   To learn more about what you can do with MyChart, go to [email or web address].    Your next appointment:   12 month(s)  The format for your next appointment:   In Person  Provider:   Belva Crome, MD    Other Instructions none  Important Information About Sugar

## 2023-10-17 LAB — VITAMIN D 25 HYDROXY (VIT D DEFICIENCY, FRACTURES): Vit D, 25-Hydroxy: 25.9 ng/mL — ABNORMAL LOW (ref 30.0–100.0)

## 2023-10-17 LAB — COMPREHENSIVE METABOLIC PANEL WITH GFR
ALT: 18 IU/L (ref 0–32)
AST: 13 IU/L (ref 0–40)
Albumin: 4.2 g/dL (ref 3.9–4.9)
Alkaline Phosphatase: 107 IU/L (ref 44–121)
BUN/Creatinine Ratio: 13 (ref 9–23)
BUN: 10 mg/dL (ref 6–20)
Bilirubin Total: 0.3 mg/dL (ref 0.0–1.2)
CO2: 21 mmol/L (ref 20–29)
Calcium: 9.2 mg/dL (ref 8.7–10.2)
Chloride: 106 mmol/L (ref 96–106)
Creatinine, Ser: 0.75 mg/dL (ref 0.57–1.00)
Globulin, Total: 2.4 g/dL (ref 1.5–4.5)
Glucose: 94 mg/dL (ref 70–99)
Potassium: 4.3 mmol/L (ref 3.5–5.2)
Sodium: 139 mmol/L (ref 134–144)
Total Protein: 6.6 g/dL (ref 6.0–8.5)
eGFR: 104 mL/min/{1.73_m2} (ref >59)

## 2023-10-17 LAB — CBC
Hematocrit: 41.2 % (ref 34.0–46.6)
Hemoglobin: 13.6 g/dL (ref 11.1–15.9)
MCH: 30.1 pg (ref 26.6–33.0)
MCHC: 33 g/dL (ref 31.5–35.7)
MCV: 91 fL (ref 79–97)
Platelets: 221 10*3/uL (ref 150–450)
RBC: 4.52 x10E6/uL (ref 3.77–5.28)
RDW: 12.4 % (ref 11.7–15.4)
WBC: 6.3 10*3/uL (ref 3.4–10.8)

## 2023-10-17 LAB — LIPID PANEL
Chol/HDL Ratio: 3.6 ratio (ref 0.0–4.4)
Cholesterol, Total: 196 mg/dL (ref 100–199)
HDL: 55 mg/dL (ref >39)
LDL Chol Calc (NIH): 120 mg/dL — ABNORMAL HIGH (ref 0–99)
Triglycerides: 116 mg/dL (ref 0–149)
VLDL Cholesterol Cal: 21 mg/dL (ref 5–40)

## 2023-10-17 LAB — HEMOGLOBIN A1C
Est. average glucose Bld gHb Est-mCnc: 111 mg/dL
Hgb A1c MFr Bld: 5.5 % (ref 4.8–5.6)

## 2023-10-17 LAB — TSH: TSH: 0.897 u[IU]/mL (ref 0.450–4.500)

## 2023-11-10 DIAGNOSIS — F411 Generalized anxiety disorder: Secondary | ICD-10-CM | POA: Diagnosis not present

## 2023-11-10 DIAGNOSIS — F322 Major depressive disorder, single episode, severe without psychotic features: Secondary | ICD-10-CM | POA: Diagnosis not present

## 2023-12-03 DIAGNOSIS — Z1331 Encounter for screening for depression: Secondary | ICD-10-CM | POA: Diagnosis not present

## 2023-12-03 DIAGNOSIS — Z1339 Encounter for screening examination for other mental health and behavioral disorders: Secondary | ICD-10-CM | POA: Diagnosis not present

## 2023-12-03 DIAGNOSIS — Z6834 Body mass index (BMI) 34.0-34.9, adult: Secondary | ICD-10-CM | POA: Diagnosis not present

## 2023-12-03 DIAGNOSIS — E669 Obesity, unspecified: Secondary | ICD-10-CM | POA: Diagnosis not present

## 2023-12-08 DIAGNOSIS — F411 Generalized anxiety disorder: Secondary | ICD-10-CM | POA: Diagnosis not present

## 2023-12-08 DIAGNOSIS — F322 Major depressive disorder, single episode, severe without psychotic features: Secondary | ICD-10-CM | POA: Diagnosis not present

## 2023-12-22 DIAGNOSIS — F322 Major depressive disorder, single episode, severe without psychotic features: Secondary | ICD-10-CM | POA: Diagnosis not present

## 2023-12-22 DIAGNOSIS — F411 Generalized anxiety disorder: Secondary | ICD-10-CM | POA: Diagnosis not present

## 2024-01-12 ENCOUNTER — Telehealth: Payer: Self-pay | Admitting: Cardiology

## 2024-01-12 ENCOUNTER — Encounter: Payer: Self-pay | Admitting: Cardiology

## 2024-01-12 NOTE — Telephone Encounter (Signed)
 Patient was sick over the weekend and stated she is in a lot of pain. Patient would like to know if she can be prescribed an anti inflammatory drug. Please advise.

## 2024-01-12 NOTE — Telephone Encounter (Signed)
 Left message for the patient to call back.

## 2024-01-13 DIAGNOSIS — F411 Generalized anxiety disorder: Secondary | ICD-10-CM | POA: Diagnosis not present

## 2024-01-13 DIAGNOSIS — F322 Major depressive disorder, single episode, severe without psychotic features: Secondary | ICD-10-CM | POA: Diagnosis not present

## 2024-01-13 NOTE — Telephone Encounter (Signed)
 Called the patient and she reported that after she ate food in the evening she has started vomiting over the past two weekends. She vomited so much that she started dry heaving. She vomited so forcefully that now it hurts her muscles in her chest to raise her head. Based on the patient's symptoms it was recommended that she follow up with her PCP. Patient verbalized understanding and had no further questions at this time.

## 2024-02-10 DIAGNOSIS — F411 Generalized anxiety disorder: Secondary | ICD-10-CM | POA: Diagnosis not present

## 2024-02-10 DIAGNOSIS — F322 Major depressive disorder, single episode, severe without psychotic features: Secondary | ICD-10-CM | POA: Diagnosis not present

## 2024-03-08 DIAGNOSIS — F411 Generalized anxiety disorder: Secondary | ICD-10-CM | POA: Diagnosis not present

## 2024-03-08 DIAGNOSIS — F322 Major depressive disorder, single episode, severe without psychotic features: Secondary | ICD-10-CM | POA: Diagnosis not present

## 2024-05-31 DIAGNOSIS — F322 Major depressive disorder, single episode, severe without psychotic features: Secondary | ICD-10-CM | POA: Diagnosis not present

## 2024-05-31 DIAGNOSIS — F411 Generalized anxiety disorder: Secondary | ICD-10-CM | POA: Diagnosis not present
